# Patient Record
Sex: Male | Born: 1940 | Race: White | Hispanic: No | Marital: Married | Smoking: Former smoker
Health system: Southern US, Community
[De-identification: ages and names within clinical notes are randomized; demographics above are authoritative.]

## PROBLEM LIST (undated history)

## (undated) DIAGNOSIS — I10 Essential (primary) hypertension: Secondary | ICD-10-CM

## (undated) DIAGNOSIS — M109 Gout, unspecified: Secondary | ICD-10-CM

## (undated) DIAGNOSIS — I119 Hypertensive heart disease without heart failure: Secondary | ICD-10-CM

## (undated) DIAGNOSIS — I5033 Acute on chronic diastolic (congestive) heart failure: Secondary | ICD-10-CM

## (undated) HISTORY — DX: Essential (primary) hypertension: I10

## (undated) HISTORY — PX: KNEE CARTILAGE SURGERY: SHX688

---

## 1997-12-11 ENCOUNTER — Encounter: Admission: RE | Admit: 1997-12-11 | Discharge: 1997-12-11 | Payer: Self-pay | Admitting: Family Medicine

## 1997-12-18 ENCOUNTER — Encounter: Admission: RE | Admit: 1997-12-18 | Discharge: 1997-12-18 | Payer: Self-pay | Admitting: Family Medicine

## 1997-12-22 ENCOUNTER — Encounter: Admission: RE | Admit: 1997-12-22 | Discharge: 1997-12-22 | Payer: Self-pay | Admitting: Family Medicine

## 1997-12-29 ENCOUNTER — Encounter: Admission: RE | Admit: 1997-12-29 | Discharge: 1997-12-29 | Payer: Self-pay | Admitting: Family Medicine

## 1998-01-05 ENCOUNTER — Encounter: Admission: RE | Admit: 1998-01-05 | Discharge: 1998-01-05 | Payer: Self-pay | Admitting: Family Medicine

## 1998-01-11 ENCOUNTER — Encounter: Admission: RE | Admit: 1998-01-11 | Discharge: 1998-01-11 | Payer: Self-pay | Admitting: Family Medicine

## 1998-01-12 ENCOUNTER — Encounter: Admission: RE | Admit: 1998-01-12 | Discharge: 1998-01-12 | Payer: Self-pay | Admitting: Family Medicine

## 1998-01-18 ENCOUNTER — Encounter: Admission: RE | Admit: 1998-01-18 | Discharge: 1998-01-18 | Payer: Self-pay | Admitting: Family Medicine

## 1998-01-19 ENCOUNTER — Encounter: Admission: RE | Admit: 1998-01-19 | Discharge: 1998-01-19 | Payer: Self-pay | Admitting: Family Medicine

## 1998-02-08 ENCOUNTER — Encounter: Admission: RE | Admit: 1998-02-08 | Discharge: 1998-02-08 | Payer: Self-pay | Admitting: Family Medicine

## 1998-03-07 ENCOUNTER — Encounter: Admission: RE | Admit: 1998-03-07 | Discharge: 1998-03-07 | Payer: Self-pay | Admitting: Family Medicine

## 1998-03-08 ENCOUNTER — Encounter: Admission: RE | Admit: 1998-03-08 | Discharge: 1998-03-08 | Payer: Self-pay | Admitting: Family Medicine

## 1999-05-03 ENCOUNTER — Encounter: Admission: RE | Admit: 1999-05-03 | Discharge: 1999-05-03 | Payer: Self-pay | Admitting: Family Medicine

## 1999-07-17 ENCOUNTER — Encounter: Admission: RE | Admit: 1999-07-17 | Discharge: 1999-07-17 | Payer: Self-pay | Admitting: Family Medicine

## 1999-07-18 ENCOUNTER — Encounter: Admission: RE | Admit: 1999-07-18 | Discharge: 1999-07-18 | Payer: Self-pay | Admitting: Family Medicine

## 2000-09-14 ENCOUNTER — Ambulatory Visit (HOSPITAL_COMMUNITY): Admission: RE | Admit: 2000-09-14 | Discharge: 2000-09-14 | Payer: Self-pay | Admitting: Family Medicine

## 2000-09-14 ENCOUNTER — Encounter: Admission: RE | Admit: 2000-09-14 | Discharge: 2000-09-14 | Payer: Self-pay | Admitting: Family Medicine

## 2000-09-17 ENCOUNTER — Encounter: Admission: RE | Admit: 2000-09-17 | Discharge: 2000-09-17 | Payer: Self-pay | Admitting: Family Medicine

## 2000-10-12 ENCOUNTER — Encounter: Admission: RE | Admit: 2000-10-12 | Discharge: 2000-10-12 | Payer: Self-pay | Admitting: Family Medicine

## 2000-10-13 ENCOUNTER — Encounter: Admission: RE | Admit: 2000-10-13 | Discharge: 2000-10-13 | Payer: Self-pay | Admitting: Sports Medicine

## 2000-10-23 ENCOUNTER — Encounter: Admission: RE | Admit: 2000-10-23 | Discharge: 2000-10-23 | Payer: Self-pay | Admitting: Family Medicine

## 2000-11-09 ENCOUNTER — Encounter: Admission: RE | Admit: 2000-11-09 | Discharge: 2000-11-09 | Payer: Self-pay | Admitting: Family Medicine

## 2000-11-16 ENCOUNTER — Encounter: Admission: RE | Admit: 2000-11-16 | Discharge: 2000-11-16 | Payer: Self-pay | Admitting: Family Medicine

## 2000-11-23 ENCOUNTER — Encounter: Admission: RE | Admit: 2000-11-23 | Discharge: 2000-11-23 | Payer: Self-pay | Admitting: Family Medicine

## 2001-01-12 ENCOUNTER — Encounter: Admission: RE | Admit: 2001-01-12 | Discharge: 2001-01-12 | Payer: Self-pay | Admitting: Family Medicine

## 2001-01-19 ENCOUNTER — Encounter: Admission: RE | Admit: 2001-01-19 | Discharge: 2001-01-19 | Payer: Self-pay | Admitting: Family Medicine

## 2001-01-22 ENCOUNTER — Encounter: Admission: RE | Admit: 2001-01-22 | Discharge: 2001-01-22 | Payer: Self-pay | Admitting: Family Medicine

## 2001-02-05 ENCOUNTER — Encounter: Admission: RE | Admit: 2001-02-05 | Discharge: 2001-02-05 | Payer: Self-pay | Admitting: Family Medicine

## 2001-02-12 ENCOUNTER — Encounter: Admission: RE | Admit: 2001-02-12 | Discharge: 2001-02-12 | Payer: Self-pay | Admitting: Family Medicine

## 2001-02-26 ENCOUNTER — Encounter: Admission: RE | Admit: 2001-02-26 | Discharge: 2001-02-26 | Payer: Self-pay | Admitting: Family Medicine

## 2001-03-05 ENCOUNTER — Encounter: Admission: RE | Admit: 2001-03-05 | Discharge: 2001-03-05 | Payer: Self-pay | Admitting: Family Medicine

## 2001-03-10 ENCOUNTER — Encounter: Admission: RE | Admit: 2001-03-10 | Discharge: 2001-03-10 | Payer: Self-pay | Admitting: Family Medicine

## 2001-03-18 ENCOUNTER — Encounter: Admission: RE | Admit: 2001-03-18 | Discharge: 2001-03-18 | Payer: Self-pay | Admitting: Family Medicine

## 2001-03-25 ENCOUNTER — Encounter: Admission: RE | Admit: 2001-03-25 | Discharge: 2001-03-25 | Payer: Self-pay | Admitting: Family Medicine

## 2001-04-01 ENCOUNTER — Encounter: Admission: RE | Admit: 2001-04-01 | Discharge: 2001-04-01 | Payer: Self-pay | Admitting: Family Medicine

## 2001-04-09 ENCOUNTER — Encounter: Admission: RE | Admit: 2001-04-09 | Discharge: 2001-04-09 | Payer: Self-pay | Admitting: Family Medicine

## 2001-04-21 ENCOUNTER — Encounter: Admission: RE | Admit: 2001-04-21 | Discharge: 2001-04-21 | Payer: Self-pay | Admitting: Family Medicine

## 2001-11-19 ENCOUNTER — Encounter: Admission: RE | Admit: 2001-11-19 | Discharge: 2001-11-19 | Payer: Self-pay | Admitting: Family Medicine

## 2001-11-22 ENCOUNTER — Encounter: Admission: RE | Admit: 2001-11-22 | Discharge: 2001-11-22 | Payer: Self-pay | Admitting: Family Medicine

## 2001-11-26 ENCOUNTER — Encounter: Admission: RE | Admit: 2001-11-26 | Discharge: 2001-11-26 | Payer: Self-pay | Admitting: Family Medicine

## 2001-12-10 ENCOUNTER — Encounter: Admission: RE | Admit: 2001-12-10 | Discharge: 2001-12-10 | Payer: Self-pay | Admitting: Family Medicine

## 2001-12-20 ENCOUNTER — Encounter: Admission: RE | Admit: 2001-12-20 | Discharge: 2001-12-20 | Payer: Self-pay | Admitting: Family Medicine

## 2002-01-07 ENCOUNTER — Encounter: Admission: RE | Admit: 2002-01-07 | Discharge: 2002-01-07 | Payer: Self-pay | Admitting: Family Medicine

## 2002-01-31 ENCOUNTER — Encounter: Admission: RE | Admit: 2002-01-31 | Discharge: 2002-01-31 | Payer: Self-pay | Admitting: Sports Medicine

## 2002-02-28 ENCOUNTER — Encounter: Admission: RE | Admit: 2002-02-28 | Discharge: 2002-02-28 | Payer: Self-pay | Admitting: Family Medicine

## 2002-08-26 ENCOUNTER — Encounter: Admission: RE | Admit: 2002-08-26 | Discharge: 2002-08-26 | Payer: Self-pay | Admitting: Family Medicine

## 2002-10-31 ENCOUNTER — Encounter: Admission: RE | Admit: 2002-10-31 | Discharge: 2002-10-31 | Payer: Self-pay | Admitting: Family Medicine

## 2002-11-02 ENCOUNTER — Encounter: Admission: RE | Admit: 2002-11-02 | Discharge: 2002-11-02 | Payer: Self-pay | Admitting: Family Medicine

## 2002-11-14 ENCOUNTER — Encounter: Admission: RE | Admit: 2002-11-14 | Discharge: 2002-11-14 | Payer: Self-pay | Admitting: Family Medicine

## 2003-05-08 ENCOUNTER — Encounter: Admission: RE | Admit: 2003-05-08 | Discharge: 2003-05-08 | Payer: Self-pay | Admitting: Sports Medicine

## 2004-06-24 ENCOUNTER — Ambulatory Visit: Payer: Self-pay | Admitting: Family Medicine

## 2005-09-22 ENCOUNTER — Ambulatory Visit: Payer: Self-pay | Admitting: Family Medicine

## 2006-11-05 DIAGNOSIS — K210 Gastro-esophageal reflux disease with esophagitis: Secondary | ICD-10-CM

## 2006-11-05 DIAGNOSIS — I10 Essential (primary) hypertension: Secondary | ICD-10-CM

## 2006-11-05 DIAGNOSIS — E781 Pure hyperglyceridemia: Secondary | ICD-10-CM | POA: Insufficient documentation

## 2006-11-05 DIAGNOSIS — H269 Unspecified cataract: Secondary | ICD-10-CM

## 2007-01-04 ENCOUNTER — Telehealth: Payer: Self-pay | Admitting: Family Medicine

## 2007-01-08 ENCOUNTER — Ambulatory Visit: Payer: Self-pay | Admitting: Family Medicine

## 2007-01-08 DIAGNOSIS — F410 Panic disorder [episodic paroxysmal anxiety] without agoraphobia: Secondary | ICD-10-CM

## 2007-01-11 ENCOUNTER — Encounter: Payer: Self-pay | Admitting: Family Medicine

## 2007-01-11 ENCOUNTER — Ambulatory Visit: Payer: Self-pay | Admitting: Family Medicine

## 2007-01-11 LAB — CONVERTED CEMR LAB
ALT: 20 units/L (ref 0–53)
AST: 13 units/L (ref 0–37)
Alkaline Phosphatase: 78 units/L (ref 39–117)
Cholesterol: 158 mg/dL (ref 0–200)
Creatinine, Ser: 0.72 mg/dL (ref 0.40–1.50)
Hemoglobin: 13.2 g/dL
LDL Cholesterol: 87 mg/dL (ref 0–99)
MCV: 79.5 fL
PSA: 1.06 ng/mL (ref 0.10–4.00)
Platelets: 222 10*3/uL
RBC: 4.95 M/uL
Sodium: 144 meq/L (ref 135–145)
TSH: 2.196 microintl units/mL (ref 0.350–5.50)
Total Bilirubin: 0.5 mg/dL (ref 0.3–1.2)
Total CHOL/HDL Ratio: 3.8
Total Protein: 6.6 g/dL (ref 6.0–8.3)
VLDL: 29 mg/dL (ref 0–40)

## 2007-10-14 ENCOUNTER — Encounter: Payer: Self-pay | Admitting: Family Medicine

## 2007-10-25 ENCOUNTER — Encounter: Payer: Self-pay | Admitting: Family Medicine

## 2007-10-28 ENCOUNTER — Encounter: Payer: Self-pay | Admitting: Family Medicine

## 2007-11-01 ENCOUNTER — Encounter: Payer: Self-pay | Admitting: Family Medicine

## 2007-11-15 ENCOUNTER — Encounter: Payer: Self-pay | Admitting: Family Medicine

## 2007-11-24 ENCOUNTER — Encounter: Payer: Self-pay | Admitting: Family Medicine

## 2007-12-09 ENCOUNTER — Encounter: Payer: Self-pay | Admitting: Family Medicine

## 2008-04-25 ENCOUNTER — Encounter: Payer: Self-pay | Admitting: *Deleted

## 2008-04-26 ENCOUNTER — Telehealth: Payer: Self-pay | Admitting: Family Medicine

## 2008-07-11 ENCOUNTER — Telehealth: Payer: Self-pay | Admitting: Family Medicine

## 2009-02-19 ENCOUNTER — Telehealth: Payer: Self-pay | Admitting: Family Medicine

## 2009-04-20 ENCOUNTER — Ambulatory Visit: Payer: Self-pay | Admitting: Family Medicine

## 2009-04-20 DIAGNOSIS — M109 Gout, unspecified: Secondary | ICD-10-CM

## 2009-04-26 ENCOUNTER — Ambulatory Visit: Payer: Self-pay | Admitting: Family Medicine

## 2009-04-26 ENCOUNTER — Encounter: Payer: Self-pay | Admitting: Family Medicine

## 2009-05-01 ENCOUNTER — Encounter: Payer: Self-pay | Admitting: Family Medicine

## 2009-05-01 LAB — CONVERTED CEMR LAB
AST: 16 units/L (ref 0–37)
BUN: 10 mg/dL (ref 6–23)
Calcium: 8.5 mg/dL (ref 8.4–10.5)
Chloride: 103 meq/L (ref 96–112)
Creatinine, Ser: 0.71 mg/dL (ref 0.40–1.50)
HDL: 37 mg/dL — ABNORMAL LOW (ref >39)
Total Bilirubin: 0.5 mg/dL (ref 0.3–1.2)
Total CHOL/HDL Ratio: 5.3
Uric Acid, Serum: 9.4 mg/dL — ABNORMAL HIGH (ref 4.0–7.8)
VLDL: 74 mg/dL — ABNORMAL HIGH (ref 0–40)

## 2009-06-05 ENCOUNTER — Telehealth: Payer: Self-pay | Admitting: Family Medicine

## 2009-08-24 ENCOUNTER — Encounter: Payer: Self-pay | Admitting: Family Medicine

## 2010-01-11 ENCOUNTER — Encounter: Payer: Self-pay | Admitting: Family Medicine

## 2010-01-30 ENCOUNTER — Telehealth: Payer: Self-pay | Admitting: Family Medicine

## 2010-02-27 ENCOUNTER — Ambulatory Visit: Payer: Self-pay | Admitting: Family Medicine

## 2010-02-27 DIAGNOSIS — M25559 Pain in unspecified hip: Secondary | ICD-10-CM

## 2010-02-27 LAB — CONVERTED CEMR LAB
ALT: 27 units/L (ref 0–53)
CO2: 31 meq/L (ref 19–32)
Creatinine, Ser: 0.87 mg/dL (ref 0.40–1.50)
Glucose, Bld: 106 mg/dL — ABNORMAL HIGH (ref 70–99)
Total Bilirubin: 0.4 mg/dL (ref 0.3–1.2)

## 2010-02-28 ENCOUNTER — Telehealth: Payer: Self-pay | Admitting: Family Medicine

## 2010-03-15 ENCOUNTER — Telehealth: Payer: Self-pay | Admitting: Family Medicine

## 2010-03-28 ENCOUNTER — Encounter: Payer: Self-pay | Admitting: Family Medicine

## 2010-04-08 ENCOUNTER — Telehealth: Payer: Self-pay | Admitting: Family Medicine

## 2010-10-08 NOTE — Assessment & Plan Note (Signed)
Summary: f/up,tcb   Vital Signs:  Patient profile:   70 year old male Height:      72 inches Weight:      285 pounds BMI:     38.79 Temp:     97.5 degrees F oral Pulse rate:   56 / minute BP sitting:   190 / 84  (left arm) Cuff size:   large  Vitals Entered By: Tessie Fass CMA (February 27, 2010 2:41 PM) CC: F/U BP Is Patient Diabetic? No Pain Assessment Patient in pain? no        CC:  F/U BP.  History of Present Illness: Feels good.  Only complaint is right hip pain.  Seems to radiate from back.  Never had Xrays. Poor frequency of FU continues. HBP remains suboptimally controled despite multiple meds. No progress with obesity.  States exercising less due to hip pain. Also C/O gout attacks.  Has not yet started allopurinol but he will   Habits & Providers  Alcohol-Tobacco-Diet     Tobacco Status: quit  Current Medications (verified): 1)  Atenolol 100 Mg Tabs (Atenolol) .... One By Mouth Two Times A Day 2)  Colchicine 0.6 Mg Tabs (Colchicine) .... One Tab Q1h As Needed Gout Attack.  Max 6 Tabs Per Day 3)  Enalapril Maleate 20 Mg  Tabs (Enalapril Maleate) .... One By Mouth Two Times Per Day 4)  Hydrochlorothiazide 25 Mg Tabs (Hydrochlorothiazide) .... One By Mouth Daily 5)  Terazosin Hcl 10 Mg Caps (Terazosin Hcl) .... One By Mouth Daily 6)  Amlodipine Besylate 10 Mg  Tabs (Amlodipine Besylate) .... Once Daily 7)  Paroxetine Hcl 20 Mg  Tabs (Paroxetine Hcl) .... One By Mouth Daily 8)  Allopurinol 100 Mg Tabs (Allopurinol) .... One By Mouth Daily For Two Weeks Then Two By Mouth Daily For Two Weeks Then Three By Mouth Daily. 9)  Aspirin 81 Mg  Tbec (Aspirin) .... One By Mouth Every Day  Allergies (verified): No Known Drug Allergies  Social History: non smoker quit 1985;  insig ETOH;  exercises none at present; retired as of 2010  diet, low Na+, low cholSmoking Status:  quit  Review of Systems  The patient denies chest pain, syncope, peripheral edema, headaches,  and transient blindness.    Physical Exam  General:  Well-developed,well-nourished,in no acute distress; alert,appropriate and cooperative throughout examination Lungs:  Normal respiratory effort, chest expands symmetrically. Lungs are clear to auscultation, no crackles or wheezes. Heart:  Normal rate and regular rhythm. S1 and S2 normal without gallop, murmur, click, rub or other extra sounds. Abdomen:  Bowel sounds positive,abdomen soft and non-tender without masses, organomegaly or hernias noted. Extremities:  No clubbing, cyanosis, edema, or deformity noted with normal full range of motion of all joints.     Impression & Recommendations:  Problem # 1:  HYPERTENSION, BENIGN SYSTEMIC (ICD-401.1) Assessment Deteriorated Increase enalapril.  FU 2 months. His updated medication list for this problem includes:    Atenolol 100 Mg Tabs (Atenolol) ..... One by mouth two times a day    Enalapril Maleate 20 Mg Tabs (Enalapril maleate) ..... One by mouth two times per day    Hydrochlorothiazide 25 Mg Tabs (Hydrochlorothiazide) ..... One by mouth daily    Terazosin Hcl 10 Mg Caps (Terazosin hcl) ..... One by mouth daily    Amlodipine Besylate 10 Mg Tabs (Amlodipine besylate) ..... Once daily  Orders: Comp Met-[facility] [id number]) [facility]- Est  Level 4 ([address]214)  BP today: 190/84 Prior BP: 207/75 (04/20/2009)  Labs  Reviewed: K+: 3.9 (04/26/2009) Creat: : 0.71 (04/26/2009)   Chol: 197 (04/26/2009)   HDL: 37 (04/26/2009)   LDL: 86 (04/26/2009)   TG: 368 (04/26/2009)  Problem # 2:  GOUT, UNSPECIFIED (ICD-274.9)  Start allopurinol His updated medication list for this problem includes:    Colchicine 0.6 Mg Tabs (Colchicine) ..... One tab q1h as needed gout attack.  max 6 tabs per day    Allopurinol 100 Mg Tabs (Allopurinol) ..... One by mouth daily for two weeks then two by mouth daily for two weeks then three by mouth daily.  Orders: [facility]- Est  Level 4 ([address]214)  Problem # 3:  OBESITY, NOS  (ICD-278.00) Assessment: Unchanged  diet and exercise  Orders: [facility]- Est  Level 4 ([address]295)  Problem # 4:  HIP PAIN (MWU-132.44) Assessment: New Start with Xrays. His updated medication list for this problem includes:    Aspirin 81 Mg Tbec (Aspirin) ..... One by mouth every day  Orders: Radiology other (Radiology Other) [facility]- Est  Level 4 (01027)  Complete Medication List: 1)  Atenolol 100 Mg Tabs (Atenolol) .... One by mouth two times a day 2)  Colchicine 0.6 Mg Tabs (Colchicine) .... One tab q1h as needed gout attack.  max 6 tabs per day 3)  Enalapril Maleate 20 Mg Tabs (Enalapril maleate) .... One by mouth two times per day 4)  Hydrochlorothiazide 25 Mg Tabs (Hydrochlorothiazide) .... One by mouth daily 5)  Terazosin Hcl 10 Mg Caps (Terazosin hcl) .... One by mouth daily 6)  Amlodipine Besylate 10 Mg Tabs (Amlodipine besylate) .... Once daily 7)  Paroxetine Hcl 20 Mg Tabs (Paroxetine hcl) .... One by mouth daily 8)  Allopurinol 100 Mg Tabs (Allopurinol) .... One by mouth daily for two weeks then two by mouth daily for two weeks then three by mouth daily. 9)  Aspirin 81 Mg Tbec (Aspirin) .... One by mouth every day  Other Orders: Hemoccult Cards (Take Home) (Hemoccult Cards) Prescriptions: PAROXETINE HCL 20 MG  TABS (PAROXETINE HCL) one by mouth daily  #90 x 3   Entered and Authorized by:   Doralee Albino MD   Signed by:   Doralee Albino MD on 02/27/2010   Method used:   Electronically to        [facility]. [phone]* (retail)       [address]       [place], [place]  [address]       Ph: [phone]       Fax: [phone]   RxID:   [id number] AMLODIPINE BESYLATE 10 MG  TABS (AMLODIPINE BESYLATE) once daily  #90 x 3   Entered and Authorized by:   Doralee Albino MD   Signed by:   Doralee Albino MD on 02/27/2010   Method used:   Electronically to        [facility]. [phone]* (retail)       [address]        [place], [place]  [address]       Ph: [phone]       Fax: [phone]   RxID:   [id number] TERAZOSIN HCL 10 MG CAPS (TERAZOSIN HCL) one by mouth daily  #90 x 3   Entered and Authorized by:   Doralee Albino MD   Signed by:   Doralee Albino MD on 02/27/2010   Method used:   Electronically to  [facility]. [phone]* (retail)       [address]       [place], [place]  [address]       Ph: [phone]       Fax: [phone]   RxID:   [id number] HYDROCHLOROTHIAZIDE 25 MG TABS (HYDROCHLOROTHIAZIDE) One by mouth daily  #90 x 3   Entered and Authorized by:   Doralee Albino MD   Signed by:   Doralee Albino MD on 02/27/2010   Method used:   Electronically to        [facility]. [phone]* (retail)       [address]       [place], [place]  [address]       Ph: [phone]       Fax: [phone]   RxID:   [id number] ENALAPRIL MALEATE 20 MG  TABS (ENALAPRIL MALEATE) one by mouth two times per day  #180 x 3   Entered and Authorized by:   Doralee Albino MD   Signed by:   Doralee Albino MD on 02/27/2010   Method used:   Electronically to        [facility]. [phone]* (retail)       [address]       [place], [place]  [address]       Ph: [phone]       Fax: [phone]   RxID:   [id number] ATENOLOL 100 MG TABS (ATENOLOL) one by mouth two times a day  #180 x 3   Entered and Authorized by:   Doralee Albino MD   Signed by:   Doralee Albino MD on 02/27/2010   Method used:   Electronically to        [facility]. [phone]* (retail)       [address]       [place], [place]  [address]       Ph: [phone]       Fax: [phone]   RxID:   [id number]    Prevention & Chronic Care Immunizations   Influenza vaccine: Historical  (08/24/2009)   Influenza vaccine due: 05/09/2010    Tetanus  booster: 04/20/2009: Tdap    Pneumococcal vaccine: Pneumovax (Medicare)  (04/20/2009)    H. zoster vaccine: Not documented  Colorectal Screening   Hemoccult: Done.  (01/06/2002)   Hemoccult action/deferral: Ordered  (02/27/2010)    Colonoscopy: Not documented   Colonoscopy action/deferral: Not indicated  (02/27/2010)  Other Screening   PSA: 1.06  (01/11/2007)   PSA action/deferral: Discussed-PSA declined  (04/20/2009)   Smoking status: quit  (02/27/2010)  Lipids   Total Cholesterol: 197  (04/26/2009)   Lipid panel action/deferral: Lipid Panel ordered   LDL: 86  (04/26/2009)   LDL Direct: Not documented   HDL: 37  (04/26/2009)   Triglycerides: 368  (04/26/2009)    SGOT (AST): 16  (04/26/2009)   BMP action: Ordered   SGPT (ALT): 25  (04/26/2009) CMP ordered    Alkaline phosphatase: 92  (04/26/2009)   Total bilirubin: 0.5  (04/26/2009)    Lipid flowsheet reviewed?: Yes   Progress toward LDL goal: At goal  Hypertension   Last Blood Pressure: 190 / 84  (02/27/2010)   Serum creatinine: 0.71  (04/26/2009)   Serum potassium 3.9  (  04/26/2009) CMP ordered     Hypertension flowsheet reviewed?: Yes   Progress toward BP goal: Unchanged  Self-Management Support :    Hypertension self-management support: Not documented    Lipid self-management support: Not documented    Nursing Instructions: Provide Hemoccult cards with instructions (see order)   Appended Document: hemoccult card results  Laboratory Results  Date/Time Received: April 22, 2010  Date/Time Reported: April 22, 2010 4:08 PM   Stool - Occult Blood Hemmoccult #1: negative Date: 04/19/2010 Hemoccult #2: negative Date: 04/20/2010 Hemoccult #3: negative Date: 04/21/2010 Comments: ...........test performed by...........Marland KitchenTerese Door, CMA       Prevention & Chronic Care Immunizations   Influenza vaccine: Historical  (08/24/2009)   Influenza vaccine due: 05/09/2010    Tetanus booster:  04/20/2009: Tdap    Pneumococcal vaccine: Pneumovax (Medicare)  (04/20/2009)    H. zoster vaccine: Not documented  Colorectal Screening   Hemoccult: Done.  (01/06/2002)   Hemoccult action/deferral: Ordered  (02/27/2010)    Colonoscopy: Not documented   Colonoscopy action/deferral: Not indicated  (02/27/2010)  Other Screening   PSA: 1.06  (01/11/2007)   PSA action/deferral: Discussed-PSA declined  (04/20/2009)   Smoking status: quit  (02/27/2010)  Lipids   Total Cholesterol: 197  (04/26/2009)   Lipid panel action/deferral: Lipid Panel ordered   LDL: 86  (04/26/2009)   LDL Direct: Not documented   HDL: 37  (04/26/2009)   Triglycerides: 368  (04/26/2009)    SGOT (AST): 20  (02/27/2010)   BMP action: Ordered   SGPT (ALT): 27  (02/27/2010)   Alkaline phosphatase: 84  (02/27/2010)   Total bilirubin: 0.4  (02/27/2010)  Hypertension   Last Blood Pressure: 190 / 84  (02/27/2010)   Serum creatinine: 0.87  (02/27/2010)   Serum potassium 4.1  (02/27/2010)  Self-Management Support :    Hypertension self-management support: Not documented    Lipid self-management support: Not documented     Appended Document: f/up,tcb    Clinical Lists Changes   Observations: Added new observation of DM PROGRESS: N/A (04/22/2010 16:35) Added new observation of DM FSREVIEW: N/A (04/22/2010 16:35) Added new observation of HEMOCCULT: neg x 3 (04/21/2010 16:35)        Prevention & Chronic Care Immunizations   Influenza vaccine: Historical  (08/24/2009)   Influenza vaccine due: 05/09/2010    Tetanus booster: 04/20/2009: Tdap    Pneumococcal vaccine: Pneumovax (Medicare)  (04/20/2009)    H. zoster vaccine: Not documented  Colorectal Screening   Hemoccult: neg x 3  (04/21/2010)   Hemoccult action/deferral: Ordered  (02/27/2010)    Colonoscopy: Not documented   Colonoscopy action/deferral: Not indicated  (02/27/2010)  Other Screening   PSA: 1.06  (01/11/2007)   PSA  action/deferral: Discussed-PSA declined  (04/20/2009)   Smoking status: quit  (02/27/2010)  Lipids   Total Cholesterol: 197  (04/26/2009)   Lipid panel action/deferral: Lipid Panel ordered   LDL: 86  (04/26/2009)   LDL Direct: Not documented   HDL: 37  (04/26/2009)   Triglycerides: 368  (04/26/2009)    SGOT (AST): 20  (02/27/2010)   BMP action: Ordered   SGPT (ALT): 27  (02/27/2010)   Alkaline phosphatase: 84  (02/27/2010)   Total bilirubin: 0.4  (02/27/2010)  Hypertension   Last Blood Pressure: 190 / 84  (02/27/2010)   Serum creatinine: 0.87  (02/27/2010)   Serum potassium 4.1  (02/27/2010)  Self-Management Support :    Hypertension self-management support: Not documented    Lipid self-management support: Not documented  Appended Document: f/up,tcb    Clinical Lists Changes  Observations: Added new observation of HEMOCULTDUE: 04/22/2011 (04/22/2010 16:39) Added new observation of DM PROGRESS: N/A (04/22/2010 16:39) Added new observation of DM FSREVIEW: N/A (04/22/2010 16:39)       Prevention & Chronic Care Immunizations   Influenza vaccine: Historical  (08/24/2009)   Influenza vaccine due: 05/09/2010    Tetanus booster: 04/20/2009: Tdap    Pneumococcal vaccine: Pneumovax (Medicare)  (04/20/2009)    H. zoster vaccine: Not documented  Colorectal Screening   Hemoccult: neg x 3  (04/21/2010)   Hemoccult action/deferral: Ordered  (02/27/2010)   Hemoccult due: 04/22/2011    Colonoscopy: Not documented   Colonoscopy action/deferral: Not indicated  (02/27/2010)  Other Screening   PSA: 1.06  (01/11/2007)   PSA action/deferral: Discussed-PSA declined  (04/20/2009)   Smoking status: quit  (02/27/2010)  Lipids   Total Cholesterol: 197  (04/26/2009)   Lipid panel action/deferral: Lipid Panel ordered   LDL: 86  (04/26/2009)   LDL Direct: Not documented   HDL: 37  (04/26/2009)   Triglycerides: 368  (04/26/2009)    SGOT (AST): 20  (02/27/2010)    BMP action: Ordered   SGPT (ALT): 27  (02/27/2010)   Alkaline phosphatase: 84  (02/27/2010)   Total bilirubin: 0.4  (02/27/2010)  Hypertension   Last Blood Pressure: 190 / 84  (02/27/2010)   Serum creatinine: 0.87  (02/27/2010)   Serum potassium 4.1  (02/27/2010)  Self-Management Support :    Hypertension self-management support: Not documented    Lipid self-management support: Not documented

## 2010-10-08 NOTE — Progress Notes (Signed)
Summary: Rx Req  Phone Note Refill Request Call back at Home Phone [phone] Message from:  Patient  Refills Requested: Medication #1:  TERAZOSIN HCL 10 MG CAPS one by mouth daily NEEDS NEW RX SENT IN TO RX BECAUSE HE IS GOING OUT OF TOWN TOMORROW.  [facility].  Initial call taken by: Clydell Hakim,  Jan 30, 2010 3:53 PM  Follow-up for Phone Call        Called.  Rx sent.  He says he will make an appointment for as soon as he gets back from vacation Follow-up by: Doralee Albino MD,  Jan 30, 2010 4:03 PM

## 2010-10-08 NOTE — Progress Notes (Signed)
Summary: Rx Prob  Phone Note Call from Patient Call back at Home Phone [phone]   Caller: Patient Summary of Call: Was instructed yesterday to increase medication, but now can't remember which one it was. Initial call taken by: Clydell Hakim,  February 28, 2010 3:05 PM  Follow-up for Phone Call        advised patient that MD had increased Enalapril 20 mg  to one tablet twice daily.. Follow-up by: Theresia Lo RN,  February 28, 2010 3:29 PM  Additional Follow-up for Phone Call Additional follow up Details #1::        This is correct. Additional Follow-up by: Doralee Albino MD,  March 01, 2010 8:25 AM

## 2010-10-08 NOTE — Progress Notes (Signed)
Summary: meds prob  Phone Note Call from Patient Call back at Home Phone [phone]   Caller: Patient Summary of Call: pt states that he is allergic to the Allopurinol and needs something different Initial call taken by: De Nurse,  March 15, 2010 12:13 PM  Follow-up for Phone Call        to pcp Follow-up by: Golden Circle RN,  March 15, 2010 12:22 PM  Additional Follow-up for Phone Call Additional follow up Details #1::        Added allopurinol to allergy list.  Was only taking cochicine for acute attacks.  Will try daily colchicine.   Additional Follow-up by: Doralee Albino MD,  March 15, 2010 12:47 PM   New Allergies: ! ALLOPURINOL (ALLOPURINOL) New/Updated Medications: COLCHICINE 0.6 MG TABS (COLCHICINE) One pill daily to prevent gout:  one tab q1h as needed gout attack.  Max 6 tabs per day New Allergies: ! ALLOPURINOL (ALLOPURINOL)

## 2010-10-08 NOTE — Progress Notes (Signed)
Summary: refill  Phone Note Refill Request Call back at Home Phone [phone] Message from:  Patient  Refills Requested: Medication #1:  COLCHICINE 0.6 MG TABS One pill daily to prevent gout:  one tab q1h as needed gout attack.  Max 6 tabs per day should he continue to take this or does it need to be changed  Initial call taken by: De Nurse,  April 08, 2010 2:27 PM  Follow-up for Phone Call        Done and discussed with patient. Follow-up by: Doralee Albino MD,  April 08, 2010 2:43 PM    New/Updated Medications: COLCRYS 0.6 MG TABS (COLCHICINE) one by mouth daily to prevent gout attackes. Prescriptions: COLCRYS 0.6 MG TABS (COLCHICINE) one by mouth daily to prevent gout attackes.  #90 x 3   Entered and Authorized by:   Doralee Albino MD   Signed by:   Doralee Albino MD on 04/08/2010   Method used:   Electronically to        [facility]. [phone]* (retail)       [address]       [place], [place]  [address]       Ph: [phone]       Fax: [phone]   RxID:   [id number]

## 2010-10-08 NOTE — Letter (Signed)
Summary: Lupita Dawn visit letter  [facility]  [address]   [place], [place] [address]   Phone: [phone]  Fax: [phone]    01/11/2010  ELIAZ FOUT [address] [place], [place]  [address]  Dear Mr. SAULTER,   We are happy to let you know that since you are covered under Medicare you are able to have a FREE visit at the [facility] to discuss your HEALTH. This is a new benefit for Medicare.  There will be no co-payment.  At this visit you will meet with Luretha Murphy an expert in wellness and the nurse practitioner at our clinic.  At this visit we will discuss ways to keep you healthy and feeling well.  This visit will not replace your regular doctor visit and we cannot refill medications.  We may schedule future blood work, give shots if needed, or schedule tests to look for hidden problems.   You will need to plan to be here at least one hour to talk about your medical history, your current status, review all of your medications, and discuss your future plans for your health.  This information will be entered into your record for your doctor to have and review.  If you are interested in staying healthy, this type of visit can help.  Please call the office at: [phone], to schedule a "Medicare Wellness Visit".  The day of the visit you should bring in all of your medications, including any vitamins, herbs, over the counter products you take.  Make a list of all the other doctors that you see, so we know who they are. If you have any other health documents please bring them.  We look forward to helping you stay healthy.  Sincerely,  Luretha Murphy NP

## 2010-11-19 ENCOUNTER — Encounter: Payer: Self-pay | Admitting: Home Health Services

## 2011-01-02 ENCOUNTER — Ambulatory Visit: Payer: Self-pay | Admitting: Home Health Services

## 2011-01-07 ENCOUNTER — Ambulatory Visit (INDEPENDENT_AMBULATORY_CARE_PROVIDER_SITE_OTHER): Payer: Medicare Other | Admitting: Home Health Services

## 2011-01-07 ENCOUNTER — Encounter: Payer: Self-pay | Admitting: Home Health Services

## 2011-01-07 VITALS — BP 197/81 | HR 49 | Temp 98.2°F | Ht 72.5 in | Wt 293.0 lb

## 2011-01-07 DIAGNOSIS — Z0000 Encounter for general adult medical examination without abnormal findings: Secondary | ICD-10-CM

## 2011-01-07 NOTE — Patient Instructions (Signed)
1. Start exercising 3-4 times a week for 30 minutes. 2. Work on losing some weight. 3. Measure blood pressure and record blood pressure every day.  Bring records to Dr. Leveda Anna. 4. Be sure to take medications everyday.  5. Review your medical wishes with family and bring a copy to Dr. Leveda Anna.

## 2011-01-07 NOTE — Progress Notes (Signed)
Patient here for annual wellness visit, patient reports: Risk Factors/Conditions needing evaluation or treatment: Patient's BP was high today. Scheduled fu appointment with PCP for next week. Home Safety: Patient lives with wife in 1 story home.  Patient has smoke detectors and does not have adaptive equipment in bathroom.  Other Information: Corrective lens: Patient wears corrective lens for reading and visits eye doctor frequently for cataract in left eye. Dentures: Patient does not have dentures and visits dentist as needed. Memory: Patient denies memory problems. Patient's Mini Mental Score (recorded in doc. flowsheet): 30 Patient reported hearing problems and will schedule an appointment with and audiologist.    Balance Abnormal Patient value  Sitting balance    Arise  Uses arms  Attempts to arise    Immediate standing balance    Standing balance    Nudge    Eyes closed    360 degree turn    Sitting down  Uses arms   Gait Abnormal Patient value  Initiation of gait    Step length-left    Step length-right    Step height-left    Step height-right    Step symmetry    Step continuity    Path    Trunk    Walking stance     Patient had difficulty walking 1 foot in step of the other.  Patient did not have any dizziness with closing eyes, neck rotation, or head back.    Annual Wellness Visit Requirements Recorded Today In  Medical, family, social history Past Medical, Family, Social History Section  Current providers Care team  Current medications Medications  Wt, BP, Ht, BMI Vital signs  Hearing assessment (welcome visit) declined  Tobacco, alcohol, illicit drug use History  ADL Nurse Assessment  Depression Screening Nurse Assessment  Cognitive impairment Nurse Assessment  Mini Mental Status Document Flowsheet  Fall Risk Nurse Assessment  Home Safety Progress Note  End of Life Planning (welcome visit) Social Documentation  Medicare preventative services Progress  Note  Risk factors/conditions needing evaluation/treatment Progress Note  Personalized health advice Patient Instructions, goals, letter  Diet & Exercise Social Documentation  Emergency Contact Social Documentation  Seat Belts Social Documentation  Sun exposure/protection Social Documentation    Medicare Prevention Plan: Recommended patient discuss ultra sound with PCP and contact pharmacy for shingles vaccine.   Recommended Medicare Prevention Screenings Men over 52 Test For Frequency Date of Last- BOLD if needed  Colorectal Cancer 1-10 yrs Declined- hemoccult cards  Prostate Cancer Never or yearly 5/08  Aortic Aneurysm Once if 65-75 with hx of smoking Discuss with PCP  Cholesterol 5 yrs 8/10  Diabetes yearly Non diabetic  HIV yearly declined  Influenza Shot yearly 9/11-[facility]  Pneumonia Shot once 8/10  Zostavax Shot once recommended

## 2011-01-08 ENCOUNTER — Encounter: Payer: Self-pay | Admitting: Home Health Services

## 2011-01-08 NOTE — Progress Notes (Signed)
  Subjective:    Patient ID: Shane Mathews, male    DOB: [birth year], 70 y.o.   MRN: [id number]  HPI I have reviewed this visit and discussed with Arlys John and agree with her documentation.       Review of Systems     Objective:   Physical Exam        Assessment & Plan:

## 2011-01-15 ENCOUNTER — Ambulatory Visit (INDEPENDENT_AMBULATORY_CARE_PROVIDER_SITE_OTHER): Payer: Medicare Other | Admitting: Family Medicine

## 2011-01-15 VITALS — BP 210/88 | HR 60 | Resp 12 | Ht 72.0 in | Wt 294.2 lb

## 2011-01-15 DIAGNOSIS — E781 Pure hyperglyceridemia: Secondary | ICD-10-CM

## 2011-01-15 DIAGNOSIS — E669 Obesity, unspecified: Secondary | ICD-10-CM

## 2011-01-15 DIAGNOSIS — I10 Essential (primary) hypertension: Secondary | ICD-10-CM

## 2011-01-15 MED ORDER — METHYLDOPA 250 MG PO TABS: 250.0000 mg | ORAL_TABLET | Freq: Three times a day (TID) | ORAL | Status: DC

## 2011-01-15 NOTE — Progress Notes (Signed)
  Subjective:    Patient ID: Shane Mathews, male    DOB: 07/20/1941, 70 y.o.   MRN: [id number]  HPI Seen recently for medicare annual.  High BP noted.  He states his BPs at home generally run 160/80.  BP here quite high.  He is compliant with all meds.  He seens ophth regularly, recent cataract surg.  No background hypertensive retinopathy reported.  Also has normal creat despite years of apparently poorly controled BP.  Never CVA or MI.    Review of Systems     Objective:   Physical Exam BP confirmed. Lungs clear Cardiac rrr without Murmur No peripheral edema       Assessment & Plan:

## 2011-01-15 NOTE — Patient Instructions (Signed)
Return for fasting blood work.  I will call a day or two later with results. The nurse will set you up for an echocardiogram to check to see if blood pressure has caused heart damage. New blood pressure med at drugstore. We will talk on phone but likely I will want to see you again in about a month to make sure BP coming down.

## 2011-01-16 ENCOUNTER — Telehealth: Payer: Self-pay | Admitting: Family Medicine

## 2011-01-16 NOTE — Telephone Encounter (Signed)
Told pt about appt for 05.16.2012 @ 1000 AM for echo at [facility]. Pt informed to report to 1st floor admissions at 945 am to register pt agreed.Laureen Ochs, Viann Shove

## 2011-01-17 ENCOUNTER — Encounter: Payer: Self-pay | Admitting: Family Medicine

## 2011-01-17 NOTE — Assessment & Plan Note (Signed)
Some recent wt loss.  He seems to be serious about this now.

## 2011-01-17 NOTE — Assessment & Plan Note (Signed)
Recheck fasting labs.  

## 2011-01-17 NOTE — Assessment & Plan Note (Signed)
I think he does have two elements: 1. Element of poorly controled BP 2. Element of [place] hypertension Will add aldomet and check echo to see if any LVH.

## 2011-01-21 ENCOUNTER — Other Ambulatory Visit: Payer: Medicare Other

## 2011-01-21 DIAGNOSIS — E781 Pure hyperglyceridemia: Secondary | ICD-10-CM

## 2011-01-21 DIAGNOSIS — I10 Essential (primary) hypertension: Secondary | ICD-10-CM

## 2011-01-21 LAB — LIPID PANEL
Cholesterol: 200 mg/dL (ref 0–200)
HDL: 42 mg/dL (ref >39)
Triglycerides: 247 mg/dL — ABNORMAL HIGH (ref <150)

## 2011-01-21 NOTE — Progress Notes (Signed)
FLP AND CMP DONE TODAY Shane Mathews 

## 2011-01-22 ENCOUNTER — Ambulatory Visit (HOSPITAL_COMMUNITY): Payer: Medicare Other

## 2011-01-22 ENCOUNTER — Encounter: Payer: Self-pay | Admitting: Family Medicine

## 2011-01-22 LAB — COMPLETE METABOLIC PANEL WITH GFR
Alkaline Phosphatase: 70 U/L (ref 39–117)
BUN: 12 mg/dL (ref 6–23)
CO2: 27 mEq/L (ref 19–32)
Creat: 0.74 mg/dL (ref 0.40–1.50)
GFR, Est African American: >60 mL/min (ref >60)
GFR, Est Non African American: >60 mL/min (ref >60)
Glucose, Bld: 116 mg/dL — ABNORMAL HIGH (ref 70–99)
Total Bilirubin: 0.5 mg/dL (ref 0.3–1.2)

## 2011-02-13 ENCOUNTER — Other Ambulatory Visit: Payer: Self-pay | Admitting: Family Medicine

## 2011-02-13 NOTE — Telephone Encounter (Signed)
Refill request

## 2011-02-19 ENCOUNTER — Ambulatory Visit: Payer: Medicare Other | Admitting: Family Medicine

## 2011-02-26 ENCOUNTER — Other Ambulatory Visit: Payer: Self-pay | Admitting: Family Medicine

## 2011-02-26 DIAGNOSIS — I10 Essential (primary) hypertension: Secondary | ICD-10-CM

## 2011-03-10 ENCOUNTER — Other Ambulatory Visit: Payer: Self-pay | Admitting: Family Medicine

## 2011-03-10 NOTE — Telephone Encounter (Signed)
Refill request

## 2011-03-19 ENCOUNTER — Ambulatory Visit (INDEPENDENT_AMBULATORY_CARE_PROVIDER_SITE_OTHER): Payer: Medicare Other | Admitting: Family Medicine

## 2011-03-19 ENCOUNTER — Encounter: Payer: Self-pay | Admitting: Family Medicine

## 2011-03-19 VITALS — BP 150/72 | HR 80 | Temp 97.6°F | Ht 72.0 in | Wt 283.6 lb

## 2011-03-19 DIAGNOSIS — I10 Essential (primary) hypertension: Secondary | ICD-10-CM

## 2011-03-19 MED ORDER — ZOSTER VACCINE LIVE 19400 UNT/0.65ML ~~LOC~~ SOLR: 0.6500 mL | Freq: Once | SUBCUTANEOUS | Status: DC

## 2011-03-19 NOTE — Assessment & Plan Note (Signed)
Well controled. 

## 2011-03-19 NOTE — Progress Notes (Signed)
  Subjective:    Patient ID: Shane Mathews, male    DOB: 1941-07-15, 70 y.o.   MRN: [id number]  HPI  It has been a great two months for Shane Mathews.  Gout not bothering him so he has been more active.  Eating healthier.  Has lost 11 pounds.  Did not get echo.      Review of Systems Denies chest pain, SOB, syncope     Objective:   Physical Exam Lungs clear. Cardiac RRR Ext no edemal       Assessment & Plan:

## 2011-03-24 ENCOUNTER — Other Ambulatory Visit: Payer: Self-pay | Admitting: Family Medicine

## 2011-03-24 NOTE — Telephone Encounter (Signed)
Refill request

## 2011-04-01 ENCOUNTER — Telehealth: Payer: Self-pay | Admitting: Home Health Services

## 2011-04-01 NOTE — Telephone Encounter (Signed)
Reviewed and appreciate Rosalita Chessman entering zostavax in health maintenance.

## 2011-04-01 NOTE — Telephone Encounter (Signed)
Spoke with Ethelene Browns.   Fu concerning his weight loss and exercise.   Pt reported feeling better and pleased with his weight loss. Pt report that has continued to walk several times a week and that his gout is feeling much better.  Pt is highly motivated for behavior changes and recognizes the improvement in his health.    I celebrated and encouraged pt to continue on his diet/exercise modification and that if he needed any support to contact [facility].   Pt reported getting zostavax vaccine on 7/19.

## 2011-04-08 ENCOUNTER — Other Ambulatory Visit: Payer: Self-pay | Admitting: Family Medicine

## 2011-04-08 NOTE — Telephone Encounter (Signed)
Refill request

## 2011-05-27 ENCOUNTER — Other Ambulatory Visit: Payer: Self-pay | Admitting: Family Medicine

## 2011-05-27 NOTE — Telephone Encounter (Signed)
Refill request

## 2011-08-25 ENCOUNTER — Other Ambulatory Visit: Payer: Self-pay | Admitting: Family Medicine

## 2011-08-25 NOTE — Telephone Encounter (Signed)
Refill request

## 2011-11-23 ENCOUNTER — Other Ambulatory Visit: Payer: Self-pay | Admitting: Family Medicine

## 2011-11-23 NOTE — Telephone Encounter (Signed)
Refill request

## 2011-11-24 ENCOUNTER — Other Ambulatory Visit: Payer: Self-pay | Admitting: Family Medicine

## 2011-11-24 DIAGNOSIS — I10 Essential (primary) hypertension: Secondary | ICD-10-CM

## 2011-11-24 MED ORDER — TERAZOSIN HCL 10 MG PO CAPS: 10.0000 mg | ORAL_CAPSULE | Freq: Every day | ORAL | Status: DC

## 2011-11-24 NOTE — Assessment & Plan Note (Signed)
Refill per fax request 

## 2012-01-07 ENCOUNTER — Other Ambulatory Visit: Payer: Self-pay | Admitting: Family Medicine

## 2012-01-08 ENCOUNTER — Encounter: Payer: Self-pay | Admitting: Home Health Services

## 2012-02-07 ENCOUNTER — Other Ambulatory Visit: Payer: Self-pay | Admitting: Family Medicine

## 2012-03-01 ENCOUNTER — Other Ambulatory Visit: Payer: Self-pay | Admitting: Family Medicine

## 2012-03-04 ENCOUNTER — Other Ambulatory Visit: Payer: Self-pay | Admitting: Family Medicine

## 2012-04-01 ENCOUNTER — Other Ambulatory Visit: Payer: Self-pay | Admitting: Family Medicine

## 2012-04-30 ENCOUNTER — Telehealth: Payer: Self-pay | Admitting: Family Medicine

## 2012-04-30 NOTE — Telephone Encounter (Signed)
Left vm for pt to return call, pt is eligible for free 30 min f/u appt, sent letter to pt.

## 2012-05-06 ENCOUNTER — Other Ambulatory Visit: Payer: Self-pay | Admitting: Family Medicine

## 2012-05-21 ENCOUNTER — Other Ambulatory Visit: Payer: Self-pay | Admitting: Family Medicine

## 2012-08-06 ENCOUNTER — Other Ambulatory Visit: Payer: Self-pay | Admitting: Family Medicine

## 2012-10-19 ENCOUNTER — Telehealth: Payer: Self-pay | Admitting: *Deleted

## 2012-10-19 NOTE — Telephone Encounter (Signed)
I will not complete this prior auth until patient is seen.  He has severe hypertension and it has been 1.5 years since his last appointment.

## 2012-10-19 NOTE — Telephone Encounter (Signed)
PA required for methyldopa. Form placed in MD box.

## 2012-10-20 NOTE — Telephone Encounter (Signed)
Message  from Dr.Hensel left for patient on voicemail.

## 2012-10-20 NOTE — Telephone Encounter (Signed)
Also contacted pharmacy to advise patient that he will need appointment before PA form can be filled out.

## 2012-10-25 ENCOUNTER — Other Ambulatory Visit: Payer: Self-pay | Admitting: Family Medicine

## 2012-10-25 MED ORDER — METHYLDOPA 250 MG PO TABS: 250.0000 mg | ORAL_TABLET | Freq: Three times a day (TID) | ORAL | Status: DC

## 2012-10-25 NOTE — Telephone Encounter (Signed)
I have been reluctant to refill meds due to long interval between appointments.  Called patient.  He has an appointment for next month which he promises to keep.  Will refill aldomet per pharm fax request.

## 2012-11-24 ENCOUNTER — Other Ambulatory Visit: Payer: Self-pay | Admitting: *Deleted

## 2012-11-24 DIAGNOSIS — I10 Essential (primary) hypertension: Secondary | ICD-10-CM

## 2012-11-25 MED ORDER — TERAZOSIN HCL 10 MG PO CAPS: 10.0000 mg | ORAL_CAPSULE | Freq: Every day | ORAL | Status: DC

## 2012-11-26 ENCOUNTER — Ambulatory Visit (HOSPITAL_COMMUNITY)
Admission: RE | Admit: 2012-11-26 | Discharge: 2012-11-26 | Disposition: A | Discharge status: Home / Self Care | Payer: Medicare Other | Source: Ambulatory Visit | Attending: Family Medicine | Admitting: Family Medicine

## 2012-11-26 ENCOUNTER — Ambulatory Visit (INDEPENDENT_AMBULATORY_CARE_PROVIDER_SITE_OTHER): Payer: Medicare Other | Admitting: Family Medicine

## 2012-11-26 ENCOUNTER — Encounter: Payer: Self-pay | Admitting: Family Medicine

## 2012-11-26 VITALS — BP 158/62 | HR 73 | Temp 99.2°F | Ht 72.0 in | Wt 285.0 lb

## 2012-11-26 DIAGNOSIS — I10 Essential (primary) hypertension: Secondary | ICD-10-CM | POA: Insufficient documentation

## 2012-11-26 DIAGNOSIS — F410 Panic disorder [episodic paroxysmal anxiety] without agoraphobia: Secondary | ICD-10-CM

## 2012-11-26 DIAGNOSIS — I4510 Unspecified right bundle-branch block: Secondary | ICD-10-CM | POA: Insufficient documentation

## 2012-11-26 DIAGNOSIS — E669 Obesity, unspecified: Secondary | ICD-10-CM

## 2012-11-26 DIAGNOSIS — E781 Pure hyperglyceridemia: Secondary | ICD-10-CM

## 2012-11-26 DIAGNOSIS — I440 Atrioventricular block, first degree: Secondary | ICD-10-CM | POA: Insufficient documentation

## 2012-11-26 NOTE — Assessment & Plan Note (Signed)
At or near goal for isolated systolic hypertension.  No symptoms.  No LVH on EKG.  Check renal function.

## 2012-11-26 NOTE — Assessment & Plan Note (Signed)
None recently.  Will do slow wean off paxil

## 2012-11-26 NOTE — Assessment & Plan Note (Signed)
His main modifiable risk factor.  Real focus on wt loss.

## 2012-11-26 NOTE — Patient Instructions (Addendum)
Weight loss is your main modifiable risk factor.  Please get serious. Come back for fasting blood work. The goal for your high blood pressure is to keep the top number 160 or below.

## 2012-11-26 NOTE — Assessment & Plan Note (Signed)
Check fasting labs 

## 2012-11-26 NOTE — Progress Notes (Signed)
  Subjective:    Patient ID: Shane Mathews, male    DOB: 09-May-1941, 72 y.o.   MRN: [id number]  HPI  FU severe hypertension.  Has not been seen for a while.  States taking all meds.  He is active and recently cut up a bunch of trees in his yard without symptoms.  Denies CP or SOB.  Follows BP at home and tends to run 140-160 systolic.       Review of Systems     Objective:   Physical ExamLungs clear Cardiac RRR without m or g.  Huston Foley. No peripheral edema Wt noted.        Assessment & Plan:

## 2012-11-30 ENCOUNTER — Other Ambulatory Visit: Payer: Medicare Other

## 2012-11-30 DIAGNOSIS — F410 Panic disorder [episodic paroxysmal anxiety] without agoraphobia: Secondary | ICD-10-CM

## 2012-11-30 DIAGNOSIS — E669 Obesity, unspecified: Secondary | ICD-10-CM

## 2012-11-30 DIAGNOSIS — I10 Essential (primary) hypertension: Secondary | ICD-10-CM

## 2012-11-30 DIAGNOSIS — E781 Pure hyperglyceridemia: Secondary | ICD-10-CM

## 2012-11-30 LAB — COMPLETE METABOLIC PANEL WITH GFR
ALT: 20 U/L (ref 0–53)
Albumin: 4 g/dL (ref 3.5–5.2)
CO2: 30 mEq/L (ref 19–32)
Calcium: 9.1 mg/dL (ref 8.4–10.5)
Chloride: 102 mEq/L (ref 96–112)
GFR, Est African American: >89 mL/min
Potassium: 3.6 mEq/L (ref 3.5–5.3)
Sodium: 141 mEq/L (ref 135–145)
Total Protein: 6.3 g/dL (ref 6.0–8.3)

## 2012-11-30 LAB — LIPID PANEL: LDL Cholesterol: 135 mg/dL — ABNORMAL HIGH (ref 0–99)

## 2012-11-30 NOTE — Progress Notes (Signed)
CMP AND FLP DONE TODAY M,ARCI Sharonda Llamas

## 2012-12-01 ENCOUNTER — Encounter: Payer: Self-pay | Admitting: Family Medicine

## 2012-12-01 ENCOUNTER — Other Ambulatory Visit: Payer: Self-pay | Admitting: Family Medicine

## 2012-12-01 DIAGNOSIS — E781 Pure hyperglyceridemia: Secondary | ICD-10-CM

## 2012-12-01 DIAGNOSIS — E7800 Pure hypercholesterolemia, unspecified: Secondary | ICD-10-CM

## 2012-12-01 MED ORDER — PRAVASTATIN SODIUM 40 MG PO TABS: 40.0000 mg | ORAL_TABLET | Freq: Every day | ORAL | Status: DC

## 2012-12-01 NOTE — Progress Notes (Signed)
Patient ID: Shane Mathews, male   DOB: 1940/10/18, 72 y.o.   MRN: [id number] Called and left message to return for blood work (A1C) and to start statin.  Also will send results letter.

## 2012-12-07 ENCOUNTER — Telehealth: Payer: Self-pay | Admitting: Family Medicine

## 2012-12-07 DIAGNOSIS — E781 Pure hyperglyceridemia: Secondary | ICD-10-CM

## 2012-12-07 NOTE — Telephone Encounter (Signed)
Patient called because he received the letter about his lab results and that he needed to be screened for diabetes and has scheduled to come in first thing on 4/8 but the orders for those labs need to be entered.

## 2012-12-07 NOTE — Telephone Encounter (Signed)
I believe there is already a future order for a POCT HgbA1C.

## 2012-12-14 ENCOUNTER — Encounter: Payer: Self-pay | Admitting: Family Medicine

## 2012-12-14 ENCOUNTER — Other Ambulatory Visit (INDEPENDENT_AMBULATORY_CARE_PROVIDER_SITE_OTHER): Payer: Medicare Other

## 2012-12-14 DIAGNOSIS — E781 Pure hyperglyceridemia: Secondary | ICD-10-CM

## 2012-12-14 NOTE — Progress Notes (Signed)
A1C DONE TODAY Shane Mathews

## 2013-02-11 ENCOUNTER — Other Ambulatory Visit: Payer: Self-pay | Admitting: Family Medicine

## 2013-02-24 ENCOUNTER — Other Ambulatory Visit: Payer: Self-pay | Admitting: Family Medicine

## 2013-03-14 ENCOUNTER — Other Ambulatory Visit: Payer: Self-pay | Admitting: Family Medicine

## 2013-03-18 ENCOUNTER — Emergency Department (HOSPITAL_COMMUNITY)
Admission: EM | Admit: 2013-03-18 | Discharge: 2013-03-18 | Disposition: A | Discharge status: Home / Self Care | Payer: Medicare Other | Attending: Emergency Medicine | Admitting: Emergency Medicine

## 2013-03-18 ENCOUNTER — Encounter (HOSPITAL_COMMUNITY): Payer: Self-pay | Admitting: Emergency Medicine

## 2013-03-18 DIAGNOSIS — I10 Essential (primary) hypertension: Secondary | ICD-10-CM | POA: Insufficient documentation

## 2013-03-18 DIAGNOSIS — Z79899 Other long term (current) drug therapy: Secondary | ICD-10-CM | POA: Insufficient documentation

## 2013-03-18 DIAGNOSIS — Z8669 Personal history of other diseases of the nervous system and sense organs: Secondary | ICD-10-CM | POA: Insufficient documentation

## 2013-03-18 DIAGNOSIS — Z7982 Long term (current) use of aspirin: Secondary | ICD-10-CM | POA: Insufficient documentation

## 2013-03-18 DIAGNOSIS — Z87891 Personal history of nicotine dependence: Secondary | ICD-10-CM | POA: Insufficient documentation

## 2013-03-18 DIAGNOSIS — M109 Gout, unspecified: Secondary | ICD-10-CM | POA: Insufficient documentation

## 2013-03-18 HISTORY — DX: Gout, unspecified: M10.9

## 2013-03-18 MED ORDER — NAPROXEN 250 MG PO TABS: 250.0000 mg | ORAL_TABLET | Freq: Two times a day (BID) | ORAL | Status: DC

## 2013-03-18 MED ORDER — NAPROXEN 500 MG PO TABS
250.0000 mg | ORAL_TABLET | Freq: Once | ORAL | Status: AC
  Administered 2013-03-18: 250 mg via ORAL
  Filled 2013-03-18: qty 1

## 2013-03-18 NOTE — ED Notes (Addendum)
Patient with left hand/wrist swollen and painful. Hx gout. Patient states that when he eats shellfish he seems to have a gout attack. Says that he currently has gout in his right foot but it is going away. Noticed the gout in foot before he noticed it in his hand. It was approx 5 days after eating the shellfish that he noticed his wrist.

## 2013-03-18 NOTE — ED Provider Notes (Addendum)
History    CSN: [id number] Arrival date & time 03/18/13  0810  First MD Initiated Contact with Patient 03/18/13 [phone]     Chief Complaint  Patient presents with  . Wrist Pain   (Consider location/radiation/quality/duration/timing/severity/associated sxs/prior Treatment) Patient is a 72 y.o. male presenting with wrist pain.  Wrist Pain This is a new problem. The current episode started more than 2 days ago. The problem occurs constantly. The problem has not changed since onset.Pertinent negatives include no chest pain, no abdominal pain and no shortness of breath. Exacerbated by: moving joint. Nothing relieves the symptoms. Treatments tried: daily gout suppressive therapy. The treatment provided no relief.   Past Medical History  Diagnosis Date  . Hypertension   . Cataract   . Gout    Past Surgical History  Procedure Laterality Date  . Knee cartilage surgery      left   Family History  Problem Relation Age of Onset  . Osteoporosis Mother   . Cancer Mother    History  Substance Use Topics  . Smoking status: Former Smoker    Quit date: 01/07/1991  . Smokeless tobacco: Never Used  . Alcohol Use: No    Review of Systems  Constitutional: Negative for fever.  Respiratory: Negative for cough and shortness of breath.   Cardiovascular: Negative for chest pain.  Gastrointestinal: Negative for nausea, vomiting, abdominal pain and diarrhea.  All other systems reviewed and are negative.    Allergies  Allopurinol  Home Medications   Current Outpatient Rx  Name  Route  Sig  Dispense  Refill  . amLODipine (NORVASC) 10 MG tablet      TAKE 1 TABLET DAILY   90 tablet   0   . aspirin 81 MG EC tablet   Oral   Take 81 mg by mouth daily.           Marland Kitchen atenolol (TENORMIN) 100 MG tablet      TAKE 1 TABLET BY MOUTH TWICE A DAY   180 tablet   3   . COLCRYS 0.6 MG tablet      TAKE 1 TABLET DAILY TO PREVENT GOUT ATTACK   90 tablet   3   . enalapril (VASOTEC) 20 MG  tablet      take 1 tablet by mouth twice a day   180 tablet   3   . hydrochlorothiazide (HYDRODIURIL) 25 MG tablet      take 1 tablet by mouth once daily   90 tablet   3     Refill Approved   . methyldopa (ALDOMET) 250 MG tablet   Oral   Take 1 tablet (250 mg total) by mouth 3 (three) times daily.   270 tablet   3     In response to your fax request   . multivitamin-iron-minerals-folic acid (CENTRUM) chewable tablet   Oral   Chew 1 tablet by mouth daily.           Marland Kitchen PARoxetine (PAXIL) 20 MG tablet      TAKE 1 TABLET BY MOUTH ONCE DAILY   90 tablet   3   . pravastatin (PRAVACHOL) 40 MG tablet   Oral   Take 1 tablet (40 mg total) by mouth daily.   90 tablet   3   . terazosin (HYTRIN) 10 MG capsule   Oral   Take 1 capsule (10 mg total) by mouth at bedtime.   90 capsule   3    BP 213/66  Pulse 73  Temp(Src) 98.1 F (36.7 C) (Oral)  Resp 18  SpO2 98% Physical Exam  Constitutional: He is oriented to person, place, and time. He appears well-developed and well-nourished. No distress.  HENT:  Head: Normocephalic and atraumatic.  Eyes: Conjunctivae are normal. No scleral icterus.  Neck: Neck supple.  Cardiovascular: Normal rate and intact distal pulses.   No murmur heard. Pulmonary/Chest: Effort normal. No stridor. No respiratory distress. He has no rales.  Abdominal: Normal appearance. He exhibits no distension.  Musculoskeletal:       Left wrist: He exhibits tenderness, swelling and effusion. He exhibits normal range of motion, no deformity and no laceration.  Neurological: He is alert and oriented to person, place, and time.  Skin: Skin is warm and dry. No rash noted.  Psychiatric: He has a normal mood and affect. His behavior is normal.    ED Course  Procedures (including critical care time) Labs Reviewed - No data to display No results found. 1. Gout attack     MDM  71 yo male with left wrist pain and swelling for several days.  He has a  history of gout and states this feels similar to that, except that it is his wrist affected, whereas he usually gets attacks in his toes or knee.  No fevers or systemic symptoms.  Left wrist has good ROM but with pain.  Is swollen and slightly warmer compared to other wrist.  Good pulses and distal perfusion/sensation.  Normal strength.  He has colchicine at home, which he does not think has been working as well as it used to for gout flare-ups.  Will treat with naproxen.  I do not think he has a septic wrist and all signs are pointing to a gout flare up.  He was given strict return precautions for worsening pain, swelling, warmth, or fevers.    He is also hypertensive.  He did not take his antihypertensives yet today, but will take them as soon as discharged . He has no signs/symptoms of end organ damage.  He also claims to have "white coat" hypertension and reports that his BPs are typically elevated when he goes to his doctor's office.    Candyce Churn, MD 03/18/13 7829  Candyce Churn, MD 03/18/13 [phone]

## 2013-03-18 NOTE — ED Notes (Signed)
Pt states he has been having L wrist pain for past several days, denies injury. Redness noted on L wrist.  PMH of gout.

## 2013-03-28 ENCOUNTER — Other Ambulatory Visit: Payer: Self-pay | Admitting: Family Medicine

## 2013-05-24 ENCOUNTER — Other Ambulatory Visit: Payer: Self-pay | Admitting: Family Medicine

## 2013-07-14 ENCOUNTER — Other Ambulatory Visit: Payer: Self-pay | Admitting: Family Medicine

## 2013-08-02 ENCOUNTER — Other Ambulatory Visit: Payer: Self-pay | Admitting: Family Medicine

## 2013-11-23 ENCOUNTER — Other Ambulatory Visit: Payer: Self-pay | Admitting: Family Medicine

## 2013-12-19 ENCOUNTER — Other Ambulatory Visit: Payer: Self-pay | Admitting: Family Medicine

## 2013-12-20 NOTE — Telephone Encounter (Signed)
[facility] for Methy[facility]opa 250 mg tablet. Formulary and PA form placed in provider box for completion. Clovis Puamika L Judaea Burgoon, RN

## 2013-12-23 ENCOUNTER — Encounter: Payer: Self-pay | Admitting: *Deleted

## 2013-12-23 NOTE — Progress Notes (Signed)
[facility] for Methyldopa 250 mg tablet.  PA completed by PCP and faxed to [facility] [place] for review.  Clovis Puamika L Cleaven Demario, RN

## 2013-12-26 NOTE — Progress Notes (Signed)
PA for Methyldopa 250 mg Tablet has been approved from 12/22/2013 - 12/23/2014.  [facility] notified.  Clovis Puamika L Celicia Minahan, RN

## 2014-02-25 ENCOUNTER — Other Ambulatory Visit: Payer: Self-pay | Admitting: Family Medicine

## 2014-02-27 ENCOUNTER — Other Ambulatory Visit: Payer: Self-pay | Admitting: Family Medicine

## 2014-02-27 ENCOUNTER — Telehealth: Payer: Self-pay | Admitting: *Deleted

## 2014-02-27 DIAGNOSIS — E781 Pure hyperglyceridemia: Secondary | ICD-10-CM

## 2014-02-27 DIAGNOSIS — I10 Essential (primary) hypertension: Secondary | ICD-10-CM

## 2014-02-27 MED ORDER — TERAZOSIN HCL 10 MG PO CAPS: 10.0000 mg | ORAL_CAPSULE | Freq: Every day | ORAL | Status: DC

## 2014-02-27 MED ORDER — PRAVASTATIN SODIUM 40 MG PO TABS: 40.0000 mg | ORAL_TABLET | Freq: Every day | ORAL | Status: DC

## 2014-02-27 MED ORDER — METHYLDOPA 250 MG PO TABS: 250.0000 mg | ORAL_TABLET | Freq: Three times a day (TID) | ORAL | Status: DC

## 2014-02-27 NOTE — Telephone Encounter (Signed)
Agree as we discussed

## 2014-02-27 NOTE — Telephone Encounter (Signed)
Pt called and stated he is out of medications:methyldopa, terazosin and pravastatin.  Pt has appt on 03/15/2014.  Medication refilled completed per verbal order given by Dr. Leveda AnnaHensel.  [name], Tamika L, RN

## 2014-03-14 ENCOUNTER — Other Ambulatory Visit: Payer: Self-pay | Admitting: Family Medicine

## 2014-03-15 ENCOUNTER — Ambulatory Visit (INDEPENDENT_AMBULATORY_CARE_PROVIDER_SITE_OTHER): Payer: Medicare Other | Admitting: Family Medicine

## 2014-03-15 ENCOUNTER — Encounter: Payer: Self-pay | Admitting: Family Medicine

## 2014-03-15 VITALS — BP 170/68 | HR 77 | Temp 98.6°F | Wt 284.0 lb

## 2014-03-15 DIAGNOSIS — I10 Essential (primary) hypertension: Secondary | ICD-10-CM

## 2014-03-15 DIAGNOSIS — E781 Pure hyperglyceridemia: Secondary | ICD-10-CM

## 2014-03-15 DIAGNOSIS — I872 Venous insufficiency (chronic) (peripheral): Secondary | ICD-10-CM

## 2014-03-15 DIAGNOSIS — Z23 Encounter for immunization: Secondary | ICD-10-CM

## 2014-03-15 DIAGNOSIS — Z1211 Encounter for screening for malignant neoplasm of colon: Secondary | ICD-10-CM

## 2014-03-15 DIAGNOSIS — E7800 Pure hypercholesterolemia, unspecified: Secondary | ICD-10-CM

## 2014-03-15 MED ORDER — TRIAMCINOLONE ACETONIDE 0.1 % EX OINT: 1.0000 "application " | TOPICAL_OINTMENT | Freq: Two times a day (BID) | CUTANEOUS | Status: DC

## 2014-03-15 NOTE — Patient Instructions (Addendum)
Make sure you do the hemocult cards. Get the fasting blood work done and I will call with results Check your blood pressures at home three times per week for 3 weeks, write them down and send me the record) Be sure to check your medications with this list to make sure nothing has dropped off. I may need to switch you to a more potent cholesterol lowering medication.  If I don't mention it when I call you with the blood results remind. I will send in a steroid cream for your foot.  If it does not clear it up in two weeks, get back to us for something called an unna boot. You will also get the new, second pneumonia vaccine today.  Prevnar. Because you now have mild iron deficiency anemia, you need a colonoscopy.

## 2014-03-16 ENCOUNTER — Other Ambulatory Visit: Payer: Medicare Other

## 2014-03-16 DIAGNOSIS — D509 Iron deficiency anemia, unspecified: Secondary | ICD-10-CM

## 2014-03-16 DIAGNOSIS — I10 Essential (primary) hypertension: Secondary | ICD-10-CM

## 2014-03-16 LAB — LIPID PANEL
Cholesterol: 148 mg/dL (ref 0–200)
HDL: 40 mg/dL (ref >39)
LDL Cholesterol: 80 mg/dL (ref 0–99)
Total CHOL/HDL Ratio: 3.7 Ratio
Triglycerides: 140 mg/dL (ref <150)
VLDL: 28 mg/dL (ref 0–40)

## 2014-03-16 LAB — CBC
HCT: 37.3 % — ABNORMAL LOW (ref 39.0–52.0)
Hemoglobin: 12.7 g/dL — ABNORMAL LOW (ref 13.0–17.0)
MCH: 24.5 pg — AB (ref 26.0–34.0)
MCHC: 34 g/dL (ref 30.0–36.0)
MCV: 71.9 fL — ABNORMAL LOW (ref 78.0–100.0)
PLATELETS: 226 10*3/uL (ref 150–400)
RBC: 5.19 MIL/uL (ref 4.22–5.81)
RDW: 16.1 % — ABNORMAL HIGH (ref 11.5–15.5)
WBC: 9.6 10*3/uL (ref 4.0–10.5)

## 2014-03-16 LAB — COMPREHENSIVE METABOLIC PANEL
ALBUMIN: 3.9 g/dL (ref 3.5–5.2)
ALT: 12 U/L (ref 0–53)
AST: 13 U/L (ref 0–37)
Alkaline Phosphatase: 76 U/L (ref 39–117)
BUN: 12 mg/dL (ref 6–23)
CALCIUM: 8.8 mg/dL (ref 8.4–10.5)
CO2: 29 meq/L (ref 19–32)
Chloride: 102 mEq/L (ref 96–112)
Creat: 0.76 mg/dL (ref 0.50–1.35)
Glucose, Bld: 112 mg/dL — ABNORMAL HIGH (ref 70–99)
Potassium: 3.7 mEq/L (ref 3.5–5.3)
SODIUM: 140 meq/L (ref 135–145)
TOTAL PROTEIN: 6.1 g/dL (ref 6.0–8.3)
Total Bilirubin: 0.4 mg/dL (ref 0.2–1.2)

## 2014-03-16 NOTE — Progress Notes (Signed)
   Subjective:    Patient ID: Shane Mathews, male    DOB: [birth year], 73 y.o.   MRN: [id number]  HPI  More a recheck of hypertension than annual exam.  Shane Mathews has not been in for over a year despite severe HBP requiring 6 antihypertensive meds to control.  He is asymptomatic.  Never had stroke or CVA.  Last labs, >1 year ago, show no renal dysfunction.  Denies chest pain, shortness of breath, neurologic symptoms or leg swelling.  Also denies lightheadedness.   Patient reports he takes his blood pressure at home and it has been running 140-150 systolic with low diastolics.  Also complains on rash on foot/ankle for weeks.      Review of Systems     Objective:   Physical Exam Lungs clear Cardiac RRR with 1/6 SEM.   Ext rash on dorsum of foot and medial ankle consistent with either contact dermatitis or chronic venous insufficiency.  Does not appear infected.         Assessment & Plan:

## 2014-03-16 NOTE — Assessment & Plan Note (Signed)
Isolated systolic hypertension.  Unclear control.  Good by home BP reads, bad by my read in office today. Will gather more readings before deciding on med alterations.

## 2014-03-16 NOTE — Assessment & Plan Note (Signed)
Also could be contact dermatitis.  Will start with just topical steroids and if fails to respond treat with unna boot.

## 2014-03-16 NOTE — Assessment & Plan Note (Signed)
Check fasting labs 

## 2014-03-16 NOTE — Progress Notes (Signed)
CMP,CBC AND FLP DONE TODAY Wilbon Obenchain 

## 2014-03-16 NOTE — Assessment & Plan Note (Signed)
Check fasting labs.  Depending on results, consider switch to atorvastatin.

## 2014-03-17 ENCOUNTER — Telehealth: Payer: Self-pay | Admitting: *Deleted

## 2014-03-17 ENCOUNTER — Encounter: Payer: Self-pay | Admitting: Gastroenterology

## 2014-03-17 DIAGNOSIS — D509 Iron deficiency anemia, unspecified: Secondary | ICD-10-CM | POA: Insufficient documentation

## 2014-03-17 NOTE — Addendum Note (Signed)
Addended by: [name], [name] on: 03/17/2014 01:23 PM   Modules accepted: Orders

## 2014-03-17 NOTE — Telephone Encounter (Signed)
Consult for colonoscopy is Wednesday 8/26 @ 9am. He will have the procedure on Wednesday 9/9 @10am  patient [facility] 9 am. [facility] [address] 3[address] floor phone number there is [phone]

## 2014-03-17 NOTE — Progress Notes (Signed)
Patient ID: Shane Mathews, male   DOB: 04/30/1941, 73 y.o.   MRN: [id number] New hypo micro anemia noted: Needs colonoscopy.

## 2014-03-17 NOTE — Assessment & Plan Note (Signed)
Patient with new iron deficiency anemia.  Now I am not satisfied with hemocult for colon cancer screen.  Needs GI referral for colonoscopy.

## 2014-03-20 NOTE — Telephone Encounter (Signed)
Pt called and wanted Dr. Leveda AnnaHensel to know that he was given the stool card and will not need the appointment for consult or procedure. He will be mailing the stool card back today. jw

## 2014-03-20 NOTE — Telephone Encounter (Signed)
Spoke to patient and made clear he needs a colonoscopy.

## 2014-03-21 LAB — POC HEMOCCULT BLD/STL (HOME/3-CARD/SCREEN)
Card #2 Fecal Occult Blod, POC: NEGATIVE
FECAL OCCULT BLD: NEGATIVE
Fecal Occult Blood, POC: NEGATIVE

## 2014-03-21 NOTE — Progress Notes (Signed)
Patient ID: Shane Mathews, male   DOB: October 15, 1940, 73 y.o.   MRN: [id number] Called and informed that hemocults were neg x 3.  Also emphasized that he still needs colonoscopy due to new iron deficiency anemia.

## 2014-03-21 NOTE — Addendum Note (Signed)
Addended by: Jennette BillBUSICK, Tanaysia Bhardwaj L on: 03/21/2014 04:31 PM   Modules accepted: Orders

## 2014-03-21 NOTE — Addendum Note (Signed)
Addended by: Farrell OursEVANS, Navid Lenzen K on: 03/21/2014 04:27 PM   Modules accepted: Orders

## 2014-03-25 ENCOUNTER — Other Ambulatory Visit: Payer: Self-pay | Admitting: Family Medicine

## 2014-03-28 ENCOUNTER — Other Ambulatory Visit: Payer: Self-pay | Admitting: Family Medicine

## 2014-04-14 ENCOUNTER — Other Ambulatory Visit: Payer: Self-pay | Admitting: Family Medicine

## 2014-04-14 MED ORDER — METHYLDOPA 500 MG PO TABS: 500.0000 mg | ORAL_TABLET | Freq: Three times a day (TID) | ORAL | Status: DC

## 2014-04-14 NOTE — Telephone Encounter (Signed)
Brought in a bunch of home BP readings.  All diastolics good.  Systolics range from a low of 136 to a high of 184.  40-50% are above 160.  Will increase methyldopa.  Discussed with patient.

## 2014-05-17 ENCOUNTER — Encounter: Payer: Medicare Other | Admitting: Gastroenterology

## 2014-05-22 ENCOUNTER — Other Ambulatory Visit: Payer: Self-pay | Admitting: Family Medicine

## 2014-07-20 ENCOUNTER — Other Ambulatory Visit: Payer: Self-pay | Admitting: Family Medicine

## 2014-08-01 ENCOUNTER — Other Ambulatory Visit: Payer: Self-pay | Admitting: Family Medicine

## 2014-11-07 ENCOUNTER — Other Ambulatory Visit: Payer: Self-pay | Admitting: *Deleted

## 2014-11-07 MED ORDER — COLCHICINE 0.6 MG PO TABS: ORAL_TABLET | ORAL | Status: DC

## 2014-11-08 ENCOUNTER — Encounter: Payer: Self-pay | Admitting: Family Medicine

## 2014-11-08 NOTE — Progress Notes (Signed)
Prior authorization for colchicine placed in MD box for completion. [facility].

## 2014-11-08 NOTE — Progress Notes (Signed)
Patient ID: Shane Mathews, male   DOB: November 15, 1940, 74 y.o.   MRN: [id number] Form completed.  Allopurinol allergy

## 2014-11-08 NOTE — Progress Notes (Signed)
PA faxed to [facility] [place] for review.  [name], Patrisia Faeth L, RN

## 2014-11-09 NOTE — Progress Notes (Signed)
Called to check status of PA.  PA pending per [facility] [place] Medicare.  [name], Annaleigh Steinmeyer L, RN

## 2014-11-13 NOTE — Progress Notes (Signed)
PA has been approved from [employer] medicare.  Approved until 11/08/2015.  [facility] aware of approval.  Reference number: [id number]. [name], Leighana Neyman L, RN

## 2015-03-01 ENCOUNTER — Telehealth: Payer: Self-pay | Admitting: *Deleted

## 2015-03-01 NOTE — Telephone Encounter (Signed)
Prior Authorization received [facility] for Methyldopa 500 mg. Formulary and PA form placed in provider box for completion. Clovis Pu, RN

## 2015-03-01 NOTE — Telephone Encounter (Signed)
PA form faxed to [employer] of [place] for review.  Martin, Tamika L, RN  

## 2015-03-01 NOTE — Telephone Encounter (Signed)
done

## 2015-03-05 NOTE — Telephone Encounter (Signed)
PA approved for Methyldopa from [employer]BS of [place].  [facility] is aware of approval.  [name], Dillyn Menna L, RN

## 2015-03-13 ENCOUNTER — Other Ambulatory Visit: Payer: Self-pay | Admitting: Family Medicine

## 2015-03-23 ENCOUNTER — Other Ambulatory Visit: Payer: Self-pay | Admitting: Family Medicine

## 2015-04-10 ENCOUNTER — Other Ambulatory Visit: Payer: Self-pay | Admitting: Family Medicine

## 2015-04-22 ENCOUNTER — Other Ambulatory Visit: Payer: Self-pay | Admitting: Family Medicine

## 2015-05-15 ENCOUNTER — Other Ambulatory Visit: Payer: Self-pay | Admitting: Family Medicine

## 2015-05-29 ENCOUNTER — Other Ambulatory Visit: Payer: Self-pay | Admitting: Family Medicine

## 2015-07-16 ENCOUNTER — Other Ambulatory Visit: Payer: Self-pay | Admitting: *Deleted

## 2015-07-17 MED ORDER — PAROXETINE HCL 20 MG PO TABS: 20.0000 mg | ORAL_TABLET | Freq: Every day | ORAL | Status: DC

## 2015-07-31 ENCOUNTER — Other Ambulatory Visit: Payer: Self-pay | Admitting: Family Medicine

## 2015-08-09 ENCOUNTER — Encounter: Payer: Self-pay | Admitting: Family Medicine

## 2015-08-09 ENCOUNTER — Ambulatory Visit (INDEPENDENT_AMBULATORY_CARE_PROVIDER_SITE_OTHER): Payer: Medicare Other | Admitting: Family Medicine

## 2015-08-09 VITALS — BP 168/68 | HR 55 | Temp 98.0°F | Ht 72.0 in | Wt 298.5 lb

## 2015-08-09 DIAGNOSIS — I10 Essential (primary) hypertension: Secondary | ICD-10-CM

## 2015-08-09 DIAGNOSIS — E7800 Pure hypercholesterolemia, unspecified: Secondary | ICD-10-CM

## 2015-08-09 DIAGNOSIS — Z1211 Encounter for screening for malignant neoplasm of colon: Secondary | ICD-10-CM | POA: Diagnosis not present

## 2015-08-09 DIAGNOSIS — I70219 Atherosclerosis of native arteries of extremities with intermittent claudication, unspecified extremity: Secondary | ICD-10-CM | POA: Insufficient documentation

## 2015-08-09 DIAGNOSIS — D509 Iron deficiency anemia, unspecified: Secondary | ICD-10-CM

## 2015-08-09 DIAGNOSIS — I70213 Atherosclerosis of native arteries of extremities with intermittent claudication, bilateral legs: Secondary | ICD-10-CM | POA: Diagnosis not present

## 2015-08-09 NOTE — Patient Instructions (Addendum)
Google "low purine diet"  Or gout diet. I recommend a check by a heart doctor.  I know you said next time.  Let me know if you change mind I will call with the blood test and leg circulation test results. Stay on all the same medicines. Yes, make weight loss a big priority. Come back fasting for your blood.

## 2015-08-10 ENCOUNTER — Other Ambulatory Visit: Payer: Medicare Other

## 2015-08-10 DIAGNOSIS — I10 Essential (primary) hypertension: Secondary | ICD-10-CM

## 2015-08-10 DIAGNOSIS — E7800 Pure hypercholesterolemia, unspecified: Secondary | ICD-10-CM

## 2015-08-10 LAB — COMPREHENSIVE METABOLIC PANEL
ALT: 12 U/L (ref 9–46)
AST: 11 U/L (ref 10–35)
Albumin: 3.5 g/dL — ABNORMAL LOW (ref 3.6–5.1)
Alkaline Phosphatase: 82 U/L (ref 40–115)
BILIRUBIN TOTAL: 0.4 mg/dL (ref 0.2–1.2)
BUN: 12 mg/dL (ref 7–25)
CALCIUM: 8.5 mg/dL — AB (ref 8.6–10.3)
CO2: 29 mmol/L (ref 20–31)
CREATININE: 0.85 mg/dL (ref 0.70–1.18)
Chloride: 99 mmol/L (ref 98–110)
GLUCOSE: 120 mg/dL — AB (ref 65–99)
Potassium: 3.7 mmol/L (ref 3.5–5.3)
SODIUM: 141 mmol/L (ref 135–146)
Total Protein: 6.3 g/dL (ref 6.1–8.1)

## 2015-08-10 LAB — LIPID PANEL
Cholesterol: 180 mg/dL (ref 125–200)
HDL: 40 mg/dL (ref >=40)
LDL CALC: 102 mg/dL (ref <130)
Total CHOL/HDL Ratio: 4.5 Ratio (ref <=5.0)
Triglycerides: 189 mg/dL — ABNORMAL HIGH (ref <150)
VLDL: 38 mg/dL — ABNORMAL HIGH (ref <30)

## 2015-08-10 LAB — CBC
HCT: 38.2 % — ABNORMAL LOW (ref 39.0–52.0)
Hemoglobin: 12.8 g/dL — ABNORMAL LOW (ref 13.0–17.0)
MCH: 26.2 pg (ref 26.0–34.0)
MCHC: 33.5 g/dL (ref 30.0–36.0)
MCV: 78.1 fL (ref 78.0–100.0)
MPV: 9.9 fL (ref 8.6–12.4)
Platelets: 208 10*3/uL (ref 150–400)
RBC: 4.89 MIL/uL (ref 4.22–5.81)
RDW: 14.2 % (ref 11.5–15.5)
WBC: 7.8 10*3/uL (ref 4.0–10.5)

## 2015-08-10 NOTE — Progress Notes (Signed)
Labs done today Shane Mathews 

## 2015-08-10 NOTE — Assessment & Plan Note (Signed)
Control is sub optimal.  I would like to refer to cards for help with BP and likely for stress testing.  He is at very high risk for CVA and MI.  Patient declined referral.   Intense wt management.  FU in 3 months.

## 2015-08-10 NOTE — Assessment & Plan Note (Signed)
Weight gain noted by me and patient.  He vows to buckle down on exercise and diet.

## 2015-08-10 NOTE — Assessment & Plan Note (Signed)
Check CBC and send home again with hemocults x3 which he will accept over colonoscopy.

## 2015-08-10 NOTE — Progress Notes (Signed)
   Subjective:    Patient ID: Fayrene FearingAnthony H Serviss, male    DOB: [birth year], 74 y.o.   MRN: [id number]  HPI Mr. Rupert comes in for recheck of HBP and annual prevent screening.  I have long been concerned by his severe, likely poorly controled HBP.  I believe he is diligent about taking his prescribed meds.  Does check his BP at home with systolics running 140s and 150s (Diastolics never a problem.  Denies CP, SOB, syncope or DOE.  Never had cardiac stress test or echo.  He does have trouble walking.  I tried to pin him down in that he has joint pain and some muscle pain with ambulation.  As best I can tell, history is consistent with claudication.  HBP is the obvious risk factor for PVD. Does have chronic, mild leg swelling and carries dx of venous insufficiency.  Due for labs.  Not fasting today.  Needs FLP due to previous increased triglycerides.     Review of Systems     Objective:   Physical Exam Weight gain noted.  BP confirmed. Neck supple, no JVD Cardiac RRR with 1/6 SEM Lungs clear Abd benign Ext 2 + bilateral edema.  Absent pedal pulses bilaterally.  No skin breakdown.        Assessment & Plan:

## 2015-08-10 NOTE — Assessment & Plan Note (Signed)
Given history consistent with claudication, absent pulses and risk factors, will refer for arterial dopplers.

## 2015-08-10 NOTE — Assessment & Plan Note (Signed)
Get FLP

## 2015-08-16 LAB — POC HEMOCCULT BLD/STL (HOME/3-CARD/SCREEN)
Card #2 Fecal Occult Blod, POC: NEGATIVE
FECAL OCCULT BLD: NEGATIVE
FECAL OCCULT BLD: NEGATIVE

## 2015-08-16 NOTE — Addendum Note (Signed)
Addended by: Jennette BillBUSICK, ROBERT L on: 08/16/2015 08:48 AM   Modules accepted: Orders

## 2015-09-04 ENCOUNTER — Other Ambulatory Visit: Payer: Self-pay | Admitting: Family Medicine

## 2015-10-29 ENCOUNTER — Other Ambulatory Visit: Payer: Self-pay | Admitting: Family Medicine

## 2016-03-06 ENCOUNTER — Other Ambulatory Visit: Payer: Self-pay | Admitting: Family Medicine

## 2016-03-13 ENCOUNTER — Other Ambulatory Visit: Payer: Self-pay | Admitting: Family Medicine

## 2016-03-21 ENCOUNTER — Other Ambulatory Visit: Payer: Self-pay | Admitting: Family Medicine

## 2016-03-25 ENCOUNTER — Other Ambulatory Visit: Payer: Self-pay | Admitting: Family Medicine

## 2016-03-25 ENCOUNTER — Telehealth: Payer: Self-pay | Admitting: *Deleted

## 2016-03-25 NOTE — Telephone Encounter (Signed)
Done

## 2016-03-25 NOTE — Telephone Encounter (Signed)
PA faxed to [employer] [place] for review.  Review process could take 24-72 hours to complete.  [name], Jerrik Housholder L, RN

## 2016-03-25 NOTE — Telephone Encounter (Signed)
[facility] mg.  PA form placed in provider box for completion. [name], Azyah Flett L, RN

## 2016-03-26 NOTE — Telephone Encounter (Signed)
PA was approved via [employer]BS of [place] Medicare until 03/24/2017.  [name], Mickala Laton L, RN

## 2016-05-15 ENCOUNTER — Other Ambulatory Visit: Payer: Self-pay | Admitting: Family Medicine

## 2016-06-20 ENCOUNTER — Other Ambulatory Visit: Payer: Self-pay | Admitting: Family Medicine

## 2016-07-14 ENCOUNTER — Other Ambulatory Visit: Payer: Self-pay | Admitting: Family Medicine

## 2016-07-30 ENCOUNTER — Other Ambulatory Visit: Payer: Self-pay | Admitting: Family Medicine

## 2016-08-11 ENCOUNTER — Telehealth: Payer: Self-pay | Admitting: Family Medicine

## 2016-08-11 NOTE — Telephone Encounter (Signed)
Would like to talk to dr hensel. He is having breathing problem with exertion.

## 2016-08-11 NOTE — Telephone Encounter (Signed)
Per patient request appointment tomorrow morning 08/12/16 at 9:30 AM with Dr. Nadine CountsGottschalk.  [name], Tamika L, RN

## 2016-08-11 NOTE — Telephone Encounter (Signed)
Called.  Having marked SOB with minimal exertion.  No SOB at rest.  With his severe, longstanding hypertension, I am most worried about CHF, but there are multiple other possibilities.  Has appointment with me on Thursday.  He is worried enough that he would like to be seen sooner.  Agree.  Will forward to triage and get him an appointment for tomorrow morning.

## 2016-08-12 ENCOUNTER — Encounter: Payer: Self-pay | Admitting: Family Medicine

## 2016-08-12 ENCOUNTER — Inpatient Hospital Stay (HOSPITAL_COMMUNITY): Payer: Medicare Other

## 2016-08-12 ENCOUNTER — Ambulatory Visit (INDEPENDENT_AMBULATORY_CARE_PROVIDER_SITE_OTHER): Payer: Medicare Other | Admitting: Family Medicine

## 2016-08-12 ENCOUNTER — Inpatient Hospital Stay (HOSPITAL_COMMUNITY)
Admission: AD | Admit: 2016-08-12 | Discharge: 2016-08-21 | DRG: 292 | Disposition: A | Discharge status: Home Health | Payer: Medicare Other | Source: Ambulatory Visit | Attending: Family Medicine | Admitting: Family Medicine

## 2016-08-12 ENCOUNTER — Other Ambulatory Visit: Payer: Self-pay

## 2016-08-12 VITALS — BP 162/52 | HR 44 | Temp 98.5°F | Ht 72.0 in | Wt 309.8 lb

## 2016-08-12 DIAGNOSIS — E785 Hyperlipidemia, unspecified: Secondary | ICD-10-CM | POA: Diagnosis present

## 2016-08-12 DIAGNOSIS — J90 Pleural effusion, not elsewhere classified: Secondary | ICD-10-CM | POA: Diagnosis present

## 2016-08-12 DIAGNOSIS — J690 Pneumonitis due to inhalation of food and vomit: Secondary | ICD-10-CM | POA: Diagnosis not present

## 2016-08-12 DIAGNOSIS — I4891 Unspecified atrial fibrillation: Secondary | ICD-10-CM | POA: Diagnosis not present

## 2016-08-12 DIAGNOSIS — R001 Bradycardia, unspecified: Secondary | ICD-10-CM | POA: Diagnosis present

## 2016-08-12 DIAGNOSIS — R946 Abnormal results of thyroid function studies: Secondary | ICD-10-CM

## 2016-08-12 DIAGNOSIS — I10 Essential (primary) hypertension: Secondary | ICD-10-CM | POA: Diagnosis not present

## 2016-08-12 DIAGNOSIS — I160 Hypertensive urgency: Secondary | ICD-10-CM | POA: Diagnosis not present

## 2016-08-12 DIAGNOSIS — E876 Hypokalemia: Secondary | ICD-10-CM | POA: Diagnosis present

## 2016-08-12 DIAGNOSIS — R609 Edema, unspecified: Secondary | ICD-10-CM | POA: Diagnosis not present

## 2016-08-12 DIAGNOSIS — I872 Venous insufficiency (chronic) (peripheral): Secondary | ICD-10-CM | POA: Diagnosis present

## 2016-08-12 DIAGNOSIS — I70213 Atherosclerosis of native arteries of extremities with intermittent claudication, bilateral legs: Secondary | ICD-10-CM | POA: Diagnosis not present

## 2016-08-12 DIAGNOSIS — F419 Anxiety disorder, unspecified: Secondary | ICD-10-CM | POA: Diagnosis present

## 2016-08-12 DIAGNOSIS — R0600 Dyspnea, unspecified: Secondary | ICD-10-CM | POA: Diagnosis present

## 2016-08-12 DIAGNOSIS — M1000 Idiopathic gout, unspecified site: Secondary | ICD-10-CM | POA: Diagnosis not present

## 2016-08-12 DIAGNOSIS — I5033 Acute on chronic diastolic (congestive) heart failure: Secondary | ICD-10-CM | POA: Diagnosis not present

## 2016-08-12 DIAGNOSIS — M109 Gout, unspecified: Secondary | ICD-10-CM | POA: Diagnosis not present

## 2016-08-12 DIAGNOSIS — M25569 Pain in unspecified knee: Secondary | ICD-10-CM

## 2016-08-12 DIAGNOSIS — G4733 Obstructive sleep apnea (adult) (pediatric): Secondary | ICD-10-CM | POA: Diagnosis present

## 2016-08-12 DIAGNOSIS — M25562 Pain in left knee: Secondary | ICD-10-CM

## 2016-08-12 DIAGNOSIS — R0601 Orthopnea: Secondary | ICD-10-CM

## 2016-08-12 DIAGNOSIS — I481 Persistent atrial fibrillation: Secondary | ICD-10-CM | POA: Diagnosis present

## 2016-08-12 DIAGNOSIS — R0609 Other forms of dyspnea: Secondary | ICD-10-CM

## 2016-08-12 DIAGNOSIS — I1A0 Resistant hypertension: Secondary | ICD-10-CM | POA: Diagnosis present

## 2016-08-12 DIAGNOSIS — I739 Peripheral vascular disease, unspecified: Secondary | ICD-10-CM | POA: Diagnosis present

## 2016-08-12 DIAGNOSIS — E781 Pure hyperglyceridemia: Secondary | ICD-10-CM | POA: Diagnosis present

## 2016-08-12 DIAGNOSIS — R739 Hyperglycemia, unspecified: Secondary | ICD-10-CM | POA: Diagnosis present

## 2016-08-12 DIAGNOSIS — D509 Iron deficiency anemia, unspecified: Secondary | ICD-10-CM | POA: Diagnosis present

## 2016-08-12 DIAGNOSIS — I110 Hypertensive heart disease with heart failure: Principal | ICD-10-CM | POA: Diagnosis present

## 2016-08-12 DIAGNOSIS — I480 Paroxysmal atrial fibrillation: Secondary | ICD-10-CM | POA: Diagnosis not present

## 2016-08-12 DIAGNOSIS — E0590 Thyrotoxicosis, unspecified without thyrotoxic crisis or storm: Secondary | ICD-10-CM | POA: Diagnosis present

## 2016-08-12 DIAGNOSIS — R0989 Other specified symptoms and signs involving the circulatory and respiratory systems: Secondary | ICD-10-CM | POA: Diagnosis present

## 2016-08-12 DIAGNOSIS — K219 Gastro-esophageal reflux disease without esophagitis: Secondary | ICD-10-CM | POA: Diagnosis present

## 2016-08-12 DIAGNOSIS — E2740 Unspecified adrenocortical insufficiency: Secondary | ICD-10-CM | POA: Diagnosis present

## 2016-08-12 DIAGNOSIS — Z87891 Personal history of nicotine dependence: Secondary | ICD-10-CM

## 2016-08-12 DIAGNOSIS — I119 Hypertensive heart disease without heart failure: Secondary | ICD-10-CM | POA: Diagnosis present

## 2016-08-12 DIAGNOSIS — Z6841 Body Mass Index (BMI) 40.0 and over, adult: Secondary | ICD-10-CM

## 2016-08-12 DIAGNOSIS — I5032 Chronic diastolic (congestive) heart failure: Secondary | ICD-10-CM | POA: Diagnosis present

## 2016-08-12 DIAGNOSIS — I4819 Other persistent atrial fibrillation: Secondary | ICD-10-CM | POA: Diagnosis present

## 2016-08-12 HISTORY — DX: Hypertensive heart disease without heart failure: I11.9

## 2016-08-12 HISTORY — DX: Acute on chronic diastolic (congestive) heart failure: I50.33

## 2016-08-12 LAB — CBC
HEMATOCRIT: 34.3 % — AB (ref 39.0–52.0)
HEMATOCRIT: 33.7 % — AB (ref 39.0–52.0)
Hemoglobin: 11 g/dL — ABNORMAL LOW (ref 13.0–17.0)
Hemoglobin: 11.2 g/dL — ABNORMAL LOW (ref 13.0–17.0)
MCH: 24 pg — ABNORMAL LOW (ref 26.0–34.0)
MCH: 24 pg — ABNORMAL LOW (ref 26.0–34.0)
MCHC: 32.7 g/dL (ref 30.0–36.0)
MCHC: 32.6 g/dL (ref 30.0–36.0)
MCV: 73.6 fL — ABNORMAL LOW (ref 78.0–100.0)
MCV: 73.6 fL — ABNORMAL LOW (ref 78.0–100.0)
PLATELETS: 219 10*3/uL (ref 150–400)
Platelets: 217 10*3/uL (ref 150–400)
RBC: 4.58 MIL/uL (ref 4.22–5.81)
RBC: 4.66 MIL/uL (ref 4.22–5.81)
RDW: 15.2 % (ref 11.5–15.5)
RDW: 15.2 % (ref 11.5–15.5)
WBC: 8.8 10*3/uL (ref 4.0–10.5)
WBC: 8.8 10*3/uL (ref 4.0–10.5)

## 2016-08-12 LAB — CREATININE, SERUM
CREATININE: 0.91 mg/dL (ref 0.61–1.24)
GFR calc Af Amer: >60 mL/min (ref >60)

## 2016-08-12 LAB — LIPID PANEL
Cholesterol: 99 mg/dL (ref 0–200)
HDL: 36 mg/dL — AB (ref >40)
LDL CALC: 36 mg/dL (ref 0–99)
TRIGLYCERIDES: 137 mg/dL (ref <150)
Total CHOL/HDL Ratio: 2.8 RATIO
VLDL: 27 mg/dL (ref 0–40)

## 2016-08-12 LAB — COMPREHENSIVE METABOLIC PANEL
ALBUMIN: 3.1 g/dL — AB (ref 3.5–5.0)
ALK PHOS: 77 U/L (ref 38–126)
ALT: 14 U/L — AB (ref 17–63)
AST: 16 U/L (ref 15–41)
Anion gap: 5 (ref 5–15)
BILIRUBIN TOTAL: 0.7 mg/dL (ref 0.3–1.2)
BUN: 10 mg/dL (ref 6–20)
CALCIUM: 8.3 mg/dL — AB (ref 8.9–10.3)
CO2: 33 mmol/L — ABNORMAL HIGH (ref 22–32)
CREATININE: 0.94 mg/dL (ref 0.61–1.24)
Chloride: 96 mmol/L — ABNORMAL LOW (ref 101–111)
GFR calc Af Amer: >60 mL/min (ref >60)
GLUCOSE: 148 mg/dL — AB (ref 65–99)
POTASSIUM: 2.8 mmol/L — AB (ref 3.5–5.1)
Sodium: 134 mmol/L — ABNORMAL LOW (ref 135–145)
TOTAL PROTEIN: 5.8 g/dL — AB (ref 6.5–8.1)

## 2016-08-12 LAB — TROPONIN I: Troponin I: <0.03 ng/mL (ref <0.03)

## 2016-08-12 LAB — BRAIN NATRIURETIC PEPTIDE: B Natriuretic Peptide: 498.9 pg/mL — ABNORMAL HIGH (ref 0.0–100.0)

## 2016-08-12 MED ORDER — TRIAMCINOLONE ACETONIDE 0.1 % EX OINT
TOPICAL_OINTMENT | Freq: Two times a day (BID) | CUTANEOUS | Status: DC
  Administered 2016-08-14 – 2016-08-21 (×6): via TOPICAL
  Filled 2016-08-12 (×2): qty 15

## 2016-08-12 MED ORDER — ASPIRIN EC 81 MG PO TBEC
81.0000 mg | DELAYED_RELEASE_TABLET | Freq: Every day | ORAL | Status: DC
  Administered 2016-08-12 – 2016-08-13 (×2): 81 mg via ORAL
  Filled 2016-08-12 (×3): qty 1

## 2016-08-12 MED ORDER — POTASSIUM CHLORIDE CRYS ER 20 MEQ PO TBCR
40.0000 meq | EXTENDED_RELEASE_TABLET | Freq: Three times a day (TID) | ORAL | Status: AC
  Administered 2016-08-12 – 2016-08-13 (×3): 40 meq via ORAL
  Filled 2016-08-12 (×3): qty 2

## 2016-08-12 MED ORDER — TERAZOSIN HCL 5 MG PO CAPS
10.0000 mg | ORAL_CAPSULE | Freq: Every day | ORAL | Status: DC
  Administered 2016-08-12 – 2016-08-14 (×3): 10 mg via ORAL
  Filled 2016-08-12 (×3): qty 2

## 2016-08-12 MED ORDER — ENALAPRIL MALEATE 20 MG PO TABS
20.0000 mg | ORAL_TABLET | Freq: Two times a day (BID) | ORAL | Status: DC
  Administered 2016-08-12 – 2016-08-21 (×18): 20 mg via ORAL
  Filled 2016-08-12 (×19): qty 1

## 2016-08-12 MED ORDER — FUROSEMIDE 10 MG/ML IJ SOLN
20.0000 mg | Freq: Once | INTRAMUSCULAR | Status: AC
  Administered 2016-08-12: 20 mg via INTRAVENOUS
  Filled 2016-08-12: qty 2

## 2016-08-12 MED ORDER — PANTOPRAZOLE SODIUM 20 MG PO TBEC
20.0000 mg | DELAYED_RELEASE_TABLET | Freq: Every day | ORAL | Status: DC
  Administered 2016-08-12 – 2016-08-21 (×10): 20 mg via ORAL
  Filled 2016-08-12 (×10): qty 1

## 2016-08-12 MED ORDER — PAROXETINE HCL 20 MG PO TABS
20.0000 mg | ORAL_TABLET | Freq: Every day | ORAL | Status: DC
  Administered 2016-08-12 – 2016-08-21 (×10): 20 mg via ORAL
  Filled 2016-08-12 (×10): qty 1

## 2016-08-12 MED ORDER — ATENOLOL 100 MG PO TABS
100.0000 mg | ORAL_TABLET | Freq: Two times a day (BID) | ORAL | Status: DC
  Filled 2016-08-12: qty 1

## 2016-08-12 MED ORDER — BENZONATATE 100 MG PO CAPS
100.0000 mg | ORAL_CAPSULE | Freq: Three times a day (TID) | ORAL | Status: DC | PRN
  Administered 2016-08-13: 100 mg via ORAL
  Filled 2016-08-12: qty 1

## 2016-08-12 MED ORDER — AMLODIPINE BESYLATE 10 MG PO TABS
10.0000 mg | ORAL_TABLET | Freq: Every day | ORAL | Status: DC
  Administered 2016-08-12 – 2016-08-21 (×10): 10 mg via ORAL
  Filled 2016-08-12 (×10): qty 1

## 2016-08-12 MED ORDER — GUAIFENESIN-DM 100-10 MG/5ML PO SYRP
5.0000 mL | ORAL_SOLUTION | ORAL | Status: DC | PRN
  Administered 2016-08-12 – 2016-08-13 (×3): 5 mL via ORAL
  Filled 2016-08-12 (×4): qty 5

## 2016-08-12 MED ORDER — HYDROCHLOROTHIAZIDE 25 MG PO TABS
25.0000 mg | ORAL_TABLET | Freq: Every day | ORAL | Status: DC
  Administered 2016-08-12: 25 mg via ORAL
  Filled 2016-08-12: qty 1

## 2016-08-12 MED ORDER — PRAVASTATIN SODIUM 40 MG PO TABS
40.0000 mg | ORAL_TABLET | Freq: Every day | ORAL | Status: DC
Start: 2016-08-12 — End: 2016-08-14
  Administered 2016-08-12 – 2016-08-13 (×2): 40 mg via ORAL
  Filled 2016-08-12 (×2): qty 1

## 2016-08-12 MED ORDER — HEPARIN SODIUM (PORCINE) 5000 UNIT/ML IJ SOLN
5000.0000 [IU] | Freq: Three times a day (TID) | INTRAMUSCULAR | Status: DC
  Administered 2016-08-12 – 2016-08-13 (×3): 5000 [IU] via SUBCUTANEOUS
  Filled 2016-08-12 (×3): qty 1

## 2016-08-12 MED ORDER — METHYLDOPA 500 MG PO TABS
500.0000 mg | ORAL_TABLET | Freq: Three times a day (TID) | ORAL | Status: DC
  Filled 2016-08-12: qty 1

## 2016-08-12 NOTE — Progress Notes (Signed)
[facility] Pt brought to the floor in stable condition. Vitals taken. Initial Assessment done. All immediate pertinent needs to patient addressed. Patient Guide given to patient. Important safety instructions relating to hospitalization reviewed with patient. Patient verbalized understanding. Will continue to monitor pt.  Jilda PandaBethany Karin Pinedo RN

## 2016-08-12 NOTE — Progress Notes (Signed)
   Subjective: CC: Dyspnea on exertion ZOX:[id number]:Shane Mathews is a 75 y.o. male presenting to clinic today for same day appointment. PCP: Sanjuana LettersHENSEL,WILLIAM ARTHUR, MD Concerns today include:  1. Dyspnea on exertion Patient notes that about 3 days ago he started having SOB w/ exertion.  He reports he has SOB even with standing up and getting dressed.  He notes that he has had "slight SOB" for months.  He reports that his feet have started swelling.  He notes that he has been sitting up for about 3 days.  He reports orthopnea for "a long time".  He reports he has been sleeping a in a lounge chair lately but states it is not because he is orthopneic.  Denies CP, Nausea, vomiting, diaphoresis.  He reports that he tries to stay away from salt.  He does eat a lot of processed and canned foods.  He reports compliance with anti-hypertensive medications, missing only a day or so each week.  BP runs 140-150's/60-70s.  Former smoker, quit 20+ years ago.  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  No dx OSA, does snore at night.  Denies apnea, night cough.  Never had a sleep study.  Additionally, patient does has someone that resides with him that is in fair health, his wife.    Social History Reviewed: former smoker. FamHx and MedHx reviewed.  Please see EMR.  ROS: Per HPI  Objective: Office vital signs reviewed. BP (!) 158/72   Pulse (!) 44   Temp 98.5 F (36.9 C) (Oral)   Ht 6' (1.829 m)   Wt (!) 309 lb 12.8 oz (140.5 kg)   SpO2 95%   BMI 42.02 kg/m   Physical Examination:  General: Awake, alert, obese, No acute distress Neck: JVD not appreciated Cardio: Bradycardic, regular rhythm, S1S2 heard, no murmurs appreciated Pulm: faint crackles at bases L>R, normal WOB on room air GI: soft, non-tender, obese Extremities: warm, +3 LE edema to knees, no cyanosis or clubbing MSK: standing extremely difficult for patient.  Requiring substantial assistance  Skin: venous stasis changes appreciated b/l  LE  Assessment/ Plan: 75 y.o. male   1. Dyspnea on exertion.  Concern for possible new onset heart failure.  Patient has chronic venous stasis changes in his LE, which make it difficult to tell if he is retaining more fluid than normal.  He has gained about 12 lb since weight check in 08/2015.  No echo on file.  Possible contributors undiagnosed OSA/OHS and uncontrolled HTN. - [facility] - Orders placed for echo, EKG, CXR, BNP, CMP, CBC - Discussed with senior resident on service - Patient hemodynamically stable for transfer to hospital floor 3E.  This patient was discussed with Dr McDiarmid, who agrees with my assessment and plan.    Raliegh IpAshly M Marsia Cino, DO PGY-3, [facility]

## 2016-08-12 NOTE — H&P (Signed)
Family Medicine Teaching [facility] Admission History and Physical Service Pager: [phone]  Patient name: Shane Mathews [facility] Medical record number: [id number] Date of birth: March 14, 1941 Age: 76 y.o. Gender: male  Primary Care Provider: Sanjuana Letters, MD Consultants: None Code Status: Full Code  Chief Complaint:  Acute Dyspnea on Exertion  Assessment and Plan: Shane Mathews is a 75 y.o. male presenting with exertional SOB . PMH is significant for HTN, chronic venous insufficiency, Anxiety, GERD, Claudication, Hyperlipidemia  Dyspnea on Exertion:  Onset several months ago, but acutely worsening over the past few days.  Reports + orthopnea and sleeps in recliner chair and has 12 pound weight gain since last year. Has had no prior workup for heart failure.  Patient is a former smoker, quit 20 years ago and does endorse a chronic cough for the past 6 months productive with clear sputum, but has no fevers, sick contacts or other upper respiratory symptoms.  Patient does have bradycardia with heart rate in the mid 40s which is about usual for him. First troponin negative.  Denies chest pain, dizziness or palpitations. Presentation is concerning for new onset heart failure given history of orthopnea and BNP of 498.9 and finding of vascular congestion on chest x-ray, pitting edema in lower extremities and bibasilar crackles.  Given 20 mg IV Lasix on arrival with urine output of 1.1 L since presentation. - Admit to FMTS under attending Fletke - Strict I's and O's - Daily weights - Follow-up echo - Follow-up EKG - Continue 20 mg IV Lasix twice a day - Note currently prescribed Atenolol. Consider transition to Coreg if HF confirmed.  Cough: Productive cough clear sputum for past 6 mo months.  Former smoker quit over 20 years ago.  No sick contacts and denies any chest pain, fevers or upper respiratory symptoms.  Does have history of GERD and takes Pepcid.  Also has a history of childhood  asthma with unknown PFT results.  Does not use any inhalers. Chest x-ray showed bilateral pleural effusions and vascular congestion and lungs were actually hypoinflated.  - Continue to monitor - Tessalon Perles 100mg  TID PRN - Consider albuterol PRN and PFTs  Anemia: Hemoglobin of 11.2 noted on admission. MCV 73. Due for colonoscopy. FOBT negative in 08/2015.  - Monitor on CBC - Consider Iron studies if no improvement  Hypertension: History of HTN on  amlodipine, atenolol, enalapril, HZTC, methyldopa, terazosin.  Question of compliance. Did not take BP meds this morning and pressure was 158/72 in the office and 165/53 upon presentation to ED.  Recent history of gout reported by patient and therefore discontinued HZTC.  - Continue home medication. Discontinued home HZTC.  - If BP refractory to home regimen consider workup for 2nd HTN  Hypokalemia: 2.8 upon presentation.  Does endorse ocasional cramping in his lower extremities at rest.   - Repleted with 40kdur x3 - f/u BMP - consider checking mg2+   Panic Attacks: Stable. Last documentation and office visit was in 2014. Currently only on Paxil 20 mg daily.  - Continue Paxil 20 mg daily  Hypertriglyceridemia: pravastatin 40mg  daily.  Lipid panel showed LDL 36, otherwise normal. - Continue pravastatin 40 mg  GERD: Reportedly takes pepcid - Protonix 20mg  daily  Intermittent Claudication:  Reported claudication in bilateral lower extremities. Office notes revealed a plan to workup with ABIs, the patient never followed up. - Follow-up ABI - Consider TED hose  FEN/GI: Heart healthy diet/ protonix Prophylaxis: subQ heparin  Disposition: admit to inpatient under attending  Dr. Randolm IdolFletke  History of Present Illness:  Shane Mathews is a 75 y.o. male presenting with exertional SOB  Recent history of gout in his L wrist, knee and foot one week ago, which made him relatively immobile during this period.  Reports some shortness of breath with  flights of stairs and lying flat in recent years. These have worsened over past several days to the point where he is unable to walk to the bathroom without stopping to gasp for air.  Denies any CP, palpitations or dizziness.  Notices increased swelling in his legs and endorsed a 12 pound weight gain over the past year.  Also endorses productive cough for past month that has worsened in the last week.   Denies any fevers, significant sick contacts.  Patient was seen in clinic today for a same day appointment.  He reports compliance with his anti-hypertensive medications, missing only a day or so each week.  He does not have a formal diagnosis of OSA/OHS but does snore.  Did not take any of his medications today.   Review Of Systems: Per HPI with the following additions:  ROS  Patient Active Problem List   Diagnosis Date Noted  . Dyspnea on exertion 08/12/2016  . Atherosclerosis of native arteries of extremity with intermittent claudication (HCC) 08/09/2015  . Anemia, iron deficiency 03/17/2014  . Chronic venous insufficiency 03/15/2014  . Pure hypercholesterolemia 12/01/2012  . HIP PAIN 02/27/2010  . GOUT, UNSPECIFIED 04/20/2009  . PANIC ATTACK 01/08/2007  . HYPERTRIGLYCERIDEMIA 11/05/2006  . Morbid obesity (HCC) 11/05/2006  . CATARACT 11/05/2006  . HYPERTENSION, BENIGN SYSTEMIC 11/05/2006  . REFLUX ESOPHAGITIS 11/05/2006    Past Medical History: Past Medical History:  Diagnosis Date  . Cataract   . Gout   . Hypertension     Past Surgical History: Past Surgical History:  Procedure Laterality Date  . KNEE CARTILAGE SURGERY     left    Social History: Social History  Substance Use Topics  . Smoking status: Former Smoker    Quit date: 01/07/1991  . Smokeless tobacco: Never Used  . Alcohol use No   Additional social history:  Please also refer to relevant sections of EMR.  Family History: Family History  Problem Relation Age of Onset  . Osteoporosis Mother   . Cancer  Mother     Allergies and Medications: Allergies  Allergen Reactions  . Asa [Aspirin] Shortness Of Breath and Other (See Comments)    Wheezing also (childhood allergy)  . Allopurinol Other (See Comments)    Unknown reaction  . Other Other (See Comments)    Develops migraines by strong smells   No current facility-administered medications on file prior to encounter.    Current Outpatient Prescriptions on File Prior to Encounter  Medication Sig Dispense Refill  . amLODipine (NORVASC) 10 MG tablet TAKE ONE TABLET EVERY DAY (Patient taking differently: Take 10 mg by mouth once a day) 90 tablet 3  . aspirin 81 MG EC tablet Take 81 mg by mouth daily.      Marland Kitchen. atenolol (TENORMIN) 100 MG tablet TAKE ONE TABLET TWICE DAILY (Patient taking differently: Take 100 mg by mouth two times a day) 180 tablet 3  . COLCRYS 0.6 MG tablet TAKE ONE TABLET EACH DAY TO PREVENT GOUTATTACK (Patient taking differently: Take 0.6 mg by mouth once a day to prevent gout attacks) 90 tablet 3  . enalapril (VASOTEC) 20 MG tablet TAKE ONE TABLET TWICE DAILY (Patient taking differently: Take 20 mg by  mouth two times a day) 180 tablet 3  . hydrochlorothiazide (HYDRODIURIL) 25 MG tablet TAKE ONE TABLET EACH DAY (Patient taking differently: Take 25 mg by mouth once a day) 90 tablet 3  . methyldopa (ALDOMET) 500 MG tablet TAKE 1 TABLET (500 MG TOTAL) BY MOUTH 3 (THREE) TIMES DAILY. (Patient taking differently: Take 500 mg by mouth three times a day) 270 tablet 3  . PARoxetine (PAXIL) 20 MG tablet TAKE ONE TABLET EACH DAY (Patient taking differently: Take 20 mg by mouth once a day) 90 tablet 3  . pravastatin (PRAVACHOL) 40 MG tablet TAKE ONE TABLET EVERY DAY (Patient taking differently: Take 40 mg by mouth once a day) 90 tablet 3  . terazosin (HYTRIN) 10 MG capsule TAKE ONE CAPSULE AT BEDTIME (Patient taking differently: Take 10 mg by mouth at bedtime) 90 capsule 3  . triamcinolone ointment (KENALOG) 0.1 % APPLY TO THE AFFECTED AREA  TWICE DAILY 454 g 1    Objective: BP (!) 161/51 (BP Location: Left Arm)   Pulse (!) 52   Temp 98.1 F (36.7 C) (Oral)   Resp 18   Ht 6' (1.829 m)   Wt (!) 307 lb 12.8 oz (139.6 kg)   SpO2 95%   BMI 41.75 kg/m  Exam: General: 75 year old male sitting up in bed appearing comfortable Eyes: Noninjected no discharge, EOMI ENTM: Moist mucous membranes, oropharynx clear Neck: Supple, no lymphadenopathy Cardiovascular: Bradycardic, regular rhythm no murmurs rubs or gallops heard Respiratory: Normal work of breathing, able to speak in full sentences, faint bibasilar crackles L>R.  Gastrointestinal: Soft nontender nondistended obese abdomen MSK: No deformities, moves all extremities, no cyanosis or clubbing Extremities: 3+ pitting edema in lower extremities up to the knees bilaterally Derm: Venous stasis changes seen on bilateral lower extremities  Neuro: CN II-10 WNL, no FND.  Psych: Normal mood and affect  Labs and Imaging: CBC BMET   Recent Labs Lab 08/12/16 1501  WBC 8.8  8.8  HGB 11.0*  11.2*  HCT 33.7*  34.3*  PLT 217  219    Recent Labs Lab 08/12/16 1501  NA 134*  K 2.8*  CL 96*  CO2 33*  BUN 10  CREATININE 0.94  0.91  GLUCOSE 148*  CALCIUM 8.3*    - BNP 498.9 - Cholesterol 99, Triglycerides 137, HDL 36, LDL 36  Dg Chest 2 View  Result Date: 08/12/2016 CLINICAL DATA:  Initial evaluation for acute dyspnea, shortness of breath. EXAM: CHEST  2 VIEW COMPARISON:  None available. FINDINGS: Moderate cardiomegaly. Mediastinal silhouette within normal limits. Aortic atherosclerosis noted. Lungs somewhat hypoinflated. Mild vascular congestion without overt pulmonary edema. Small bilateral pleural effusions. Associated bibasilar opacities favored to reflect effusion and/or atelectasis. Superimposed infiltrates not entirely excluded, particularly at the right lung base. No other focal airspace disease. No pneumothorax. No acute osseous abnormality. IMPRESSION: 1. Small  layering bilateral pleural effusions with associated bibasilar opacities. Atelectasis is favored, although infiltrates could be considered in the correct clinical setting. 2. Moderate cardiomegaly with mild diffuse pulmonary vascular congestion without overt pulmonary edema. 3. Aortic atherosclerosis. Electronically Signed   By: Rise MuBenjamin  McClintock M.D.   On: 08/12/2016 13:14   Dr. Serafina Royalsan Warden PGY-1, [place] [facility] FPTS Intern pager: [phone], text pages welcome  Upper Level Addendum:  I have seen and evaluated this patient along with Dr. Myrtie SomanWarden and reviewed the above note, making necessary revisions in blue.   Dr. Garry Heateraleigh Westlynn Fifer, DO, PGY3 08/12/2016; 8:49 PM

## 2016-08-12 NOTE — H&P (Signed)
I have interviewed and examined the patient with Dr Nadine CountsGottschalk.  I have discussed the case and verified the key findings with Dr. Nadine CountsGottschalk.   I agree with their assessments and plans as documented in their admission note.   1. Acute Dyspnea on Exertion - Onset several months ago, progressive over last 3 days - (+) orthopnea - sleeping in recliner chair. - Increase body weight ~11 pounds since last measure a year ago - Chronic woody leg edema, bilaterally with venous dermatitis - No prior work up for Heart Failure - Hx of refractory hypertension, possible peripheral artery disease manifest as leg claudication - Patient believes he will be unable to climb steps to get back into his home secondary to dyspnea and knee pain.  - Patient lives with wife who is in fair health.  No personal care services.  - Patient med list  Beer's List medication (methyldopa Cherene Julian[bradycardia / orthostasis], terazosin [orthostasis} - Physical Exam with high SBP, bradycardia, left basilar crackles, No wheezing, No gallup, No murmur, Woody edema with venous stasis changes bilaterally) - Ddx: Acute decompensated heart failure, COPD exacerbation, P.E., pneumonia, OSA, OHS)  Recommendations - Admit patient given need for diagnostic and therapeutic interventions that will need close monitoring, patient's ambulatory and ADL limitations, and limited resource in community to support patient during this process.  Patient [facility] as hemodynamically stable, not requiring oxygen at rest, no current need for IV access.   - If patient does not have a hospital bed by noon, [facility], evalaution and treatment with [facility].  [facility] given limited resources for task.  - CXR (2V), BNP, Cardiac Enzymes, EKG, CME, telemetry, Echocardiogram  - Trial of IV  furosemide 40 mg, double dosing until effective dose found then use effective dose multiple times a day. - Leave decision as to whether to at prednisone / antibiotic / SABA for AE-COPD to FMTS - Unna boot wraps bilaterally once effective diuresis established for venous stasis dermatitis - Consider stopping or decreasing methydopa & atenolol dosages given patient's bradycardia - Physical Therapy consult for gait, balance, strength and endurance impairment. Also, assessment for need for Skilled Nursing Facility placement for rehabilitation - Occupational Therapy consult for capacity for ADLs - Case Management for arranging skilled nursing care follow up to monitor cardiac status and home safety evaluation.  - Overnight pulse ox on room air to look for evidence of desaturation of OSA - Check ambulatory pulse ox off and on oxygen both at rest and with exertion to see if patient requires home oxygen therapy.

## 2016-08-13 ENCOUNTER — Encounter (HOSPITAL_COMMUNITY): Payer: Self-pay | Admitting: *Deleted

## 2016-08-13 ENCOUNTER — Inpatient Hospital Stay (HOSPITAL_COMMUNITY): Payer: Medicare Other

## 2016-08-13 ENCOUNTER — Encounter (HOSPITAL_COMMUNITY): Payer: Medicare Other

## 2016-08-13 DIAGNOSIS — R0989 Other specified symptoms and signs involving the circulatory and respiratory systems: Secondary | ICD-10-CM | POA: Diagnosis present

## 2016-08-13 DIAGNOSIS — R739 Hyperglycemia, unspecified: Secondary | ICD-10-CM

## 2016-08-13 DIAGNOSIS — R001 Bradycardia, unspecified: Secondary | ICD-10-CM | POA: Diagnosis present

## 2016-08-13 DIAGNOSIS — J90 Pleural effusion, not elsewhere classified: Secondary | ICD-10-CM

## 2016-08-13 DIAGNOSIS — I70213 Atherosclerosis of native arteries of extremities with intermittent claudication, bilateral legs: Secondary | ICD-10-CM

## 2016-08-13 DIAGNOSIS — R0600 Dyspnea, unspecified: Secondary | ICD-10-CM

## 2016-08-13 DIAGNOSIS — E876 Hypokalemia: Secondary | ICD-10-CM | POA: Diagnosis present

## 2016-08-13 LAB — CBC
HCT: 32.8 % — ABNORMAL LOW (ref 39.0–52.0)
HEMOGLOBIN: 10.5 g/dL — AB (ref 13.0–17.0)
MCH: 23.8 pg — AB (ref 26.0–34.0)
MCHC: 32 g/dL (ref 30.0–36.0)
MCV: 74.4 fL — ABNORMAL LOW (ref 78.0–100.0)
PLATELETS: 199 10*3/uL (ref 150–400)
RBC: 4.41 MIL/uL (ref 4.22–5.81)
RDW: 15.5 % (ref 11.5–15.5)
WBC: 7.8 10*3/uL (ref 4.0–10.5)

## 2016-08-13 LAB — BASIC METABOLIC PANEL
Anion gap: 8 (ref 5–15)
BUN: 7 mg/dL (ref 6–20)
CALCIUM: 8.4 mg/dL — AB (ref 8.9–10.3)
CO2: 31 mmol/L (ref 22–32)
CREATININE: 0.96 mg/dL (ref 0.61–1.24)
Chloride: 95 mmol/L — ABNORMAL LOW (ref 101–111)
GFR calc Af Amer: >60 mL/min (ref >60)
GFR calc non Af Amer: >60 mL/min (ref >60)
GLUCOSE: 110 mg/dL — AB (ref 65–99)
Potassium: 3 mmol/L — ABNORMAL LOW (ref 3.5–5.1)
Sodium: 134 mmol/L — ABNORMAL LOW (ref 135–145)

## 2016-08-13 LAB — URIC ACID: Uric Acid, Serum: 8.4 mg/dL — ABNORMAL HIGH (ref 4.4–7.6)

## 2016-08-13 LAB — HEMOGLOBIN A1C
HEMOGLOBIN A1C: 6.4 % — AB (ref 4.8–5.6)
MEAN PLASMA GLUCOSE: 137 mg/dL

## 2016-08-13 LAB — ECHOCARDIOGRAM COMPLETE
HEIGHTINCHES: 72 in
Weight: 4832 oz

## 2016-08-13 LAB — MAGNESIUM: MAGNESIUM: 2.1 mg/dL (ref 1.7–2.4)

## 2016-08-13 MED ORDER — LORAZEPAM 0.5 MG PO TABS
0.5000 mg | ORAL_TABLET | ORAL | Status: DC | PRN
  Administered 2016-08-15: 0.5 mg via ORAL
  Filled 2016-08-13: qty 1

## 2016-08-13 MED ORDER — HYDRALAZINE HCL 50 MG PO TABS
50.0000 mg | ORAL_TABLET | Freq: Three times a day (TID) | ORAL | Status: DC
  Administered 2016-08-13 – 2016-08-14 (×3): 50 mg via ORAL
  Filled 2016-08-13 (×3): qty 1

## 2016-08-13 MED ORDER — FUROSEMIDE 20 MG PO TABS
20.0000 mg | ORAL_TABLET | Freq: Two times a day (BID) | ORAL | Status: DC
  Administered 2016-08-13: 20 mg via ORAL
  Filled 2016-08-13: qty 1

## 2016-08-13 MED ORDER — LORAZEPAM 0.5 MG PO TABS
0.5000 mg | ORAL_TABLET | Freq: Two times a day (BID) | ORAL | Status: DC
  Administered 2016-08-13 – 2016-08-21 (×13): 0.5 mg via ORAL
  Filled 2016-08-13 (×13): qty 1

## 2016-08-13 MED ORDER — SPIRONOLACTONE 25 MG PO TABS
25.0000 mg | ORAL_TABLET | Freq: Every day | ORAL | Status: DC
  Administered 2016-08-13: 25 mg via ORAL
  Filled 2016-08-13 (×2): qty 1

## 2016-08-13 MED ORDER — APIXABAN 5 MG PO TABS
5.0000 mg | ORAL_TABLET | Freq: Two times a day (BID) | ORAL | Status: DC
  Administered 2016-08-13 – 2016-08-21 (×16): 5 mg via ORAL
  Filled 2016-08-13 (×17): qty 1

## 2016-08-13 MED ORDER — FUROSEMIDE 10 MG/ML IJ SOLN
40.0000 mg | Freq: Two times a day (BID) | INTRAMUSCULAR | Status: DC
  Administered 2016-08-13 – 2016-08-14 (×2): 40 mg via INTRAVENOUS
  Filled 2016-08-13 (×2): qty 4

## 2016-08-13 MED ORDER — FUROSEMIDE 10 MG/ML IJ SOLN
20.0000 mg | Freq: Two times a day (BID) | INTRAMUSCULAR | Status: DC
  Filled 2016-08-13: qty 2

## 2016-08-13 MED ORDER — TRAMADOL HCL 50 MG PO TABS
50.0000 mg | ORAL_TABLET | Freq: Four times a day (QID) | ORAL | Status: DC | PRN
  Filled 2016-08-13: qty 1

## 2016-08-13 MED ORDER — POTASSIUM CHLORIDE CRYS ER 20 MEQ PO TBCR: 40.0000 meq | EXTENDED_RELEASE_TABLET | Freq: Four times a day (QID) | ORAL | Status: DC

## 2016-08-13 MED ORDER — POTASSIUM CHLORIDE CRYS ER 20 MEQ PO TBCR
40.0000 meq | EXTENDED_RELEASE_TABLET | Freq: Four times a day (QID) | ORAL | Status: AC
  Administered 2016-08-13 (×2): 40 meq via ORAL
  Filled 2016-08-13 (×2): qty 2

## 2016-08-13 NOTE — Progress Notes (Signed)
PT Cancellation Note  Patient Details Name: Shane Mathews MRN: [id number] DOB: 03/22/1941   Cancelled Treatment:    Reason Eval/Treat Not Completed: Patient declined, no reason specified. Pt refusing to participate in therapy session secondary to "breathing issues". Pt's SPO2 was 94% on RA in bed with HOB elevated. However, pt continuing to refuse participation in PT evaluation and stating that he would work with therapy tomorrow. PT will continue to f/u with pt as appropriate.   Alessandra BevelsJennifer M Betzabeth Derringer 08/13/2016, 1:34 PM Deborah ChalkJennifer Raffaela Ladley, PT, DPT [phone]

## 2016-08-13 NOTE — Progress Notes (Signed)
VASCULAR LAB PRELIMINARY  ARTERIAL  ABI completed: Right ABI of 0.71 and left ABI of 0.69 are suggestive of moderate arterial occlusive disease at rest. Right TBI of 0.46 and left TBI of 0.47 are suggestive of abnormal arterial flow at rest.   RIGHT    LEFT    PRESSURE WAVEFORM  PRESSURE WAVEFORM  BRACHIAL 198 Triphasic BRACHIAL 210 Triphasic  DP 150 Monophasic DP 144 Monophasic  PT 148 Monophasic PT 144 Monophasic  GREAT TOE 97 NA GREAT TOE 99 NA    RIGHT LEFT  ABI 0.71 0.69     Elsie StainGregory J Paytin Ramakrishnan, RVT 08/13/2016, 3:01 PM

## 2016-08-13 NOTE — Progress Notes (Signed)
  Echocardiogram 2D Echocardiogram has been performed.  Shane Mathews 08/13/2016, 10:55 AM

## 2016-08-13 NOTE — Progress Notes (Signed)
PCP note: There comes a time of reckoning:  Likely, now is likely that time for my long time patient, Shane Mathews.  He has an outpatient appointment with me for tomorrow for "some small problems" but could not wait.  Main issues are: DOE 1. While the echo is just being done, I will be shocked if it does not demonstrate class 3  To 4 CHF.  Etiology could be hypertensive cardiomyopathy and is also at risk for ischemic cardiomyopathy.  He has bradycardia and new to me atrial fibrillation which may be contributing to his symptomatology.  Absolutely needs inpatient cards consult, likely cath, likely anticoag and likely revamping of his whole treatment approach.  2. Difficulty standing: Again, [name] has not allowed me to do much testing on him.  I suspect he has both significant osteoarthritis of knees and/or hips.  He also likely has claudication from PVD.  We should better define his problems while an inpatient.  I cannot rely on him to follow through with testing as an outpatient.  We should also get PT involved to better assess ambulatory status.  He also has a history of gout, which may be contributing.  We should check a serum uric acid.   3. Anxiety: He is already asking when he can go home.  "I start having panic attacks the longer I stare at these four walls."  I will add a low dose benzo for in hospital use only (do not DC on benzo.)  He tells me his anxiety is a big reason he has avoided doctors and follow up visits.   4. Longstanding severe hypertension.  On multidrug regimen with likely poor control.  Bradycardia will require changes to that regimen. 5. Morbid obesity.  He told me again today that he was getting serious about this - the same thing he told me at my last visit with him a year ago.   6. At risk for OSA.   See resident notes for full problem list.

## 2016-08-13 NOTE — Progress Notes (Signed)
Family Medicine Teaching Service Daily Progress Note Intern Pager: [phone]  [facility] record number: [id number] Date of birth: Dec 11, 1940 Age: 75 y.o. Gender: male  Primary Care Provider: Sanjuana LettersHENSEL,WILLIAM ARTHUR, Mathews Consultants: None Code Status: Full  Pt Overview and Major Events to Date:  1. Admit to FMTS 08/12/16  Assessment and Plan: Shane Mathews is a 75 y.o. male presenting with exertional SOB . PMH is significant for HTN, chronic venous insufficiency, Anxiety, GERD, Claudication, Hyperlipidemia  Dyspnea on Exertion:  Onset several months ago, but acutely worsening over the past few days.  Reports + orthopnea and sleeps in recliner chair and has 12 pound weight gain since last year. Has had no prior workup for heart failure.  Patient is a former smoker, quit 20 years ago and does endorse a chronic cough for the past 6 months productive with clear sputum, but has no fevers, sick contacts or other upper respiratory symptoms.  Patient does have bradycardia with heart rate in the mid 40s which is about usual for him. First troponin negative.  Denies chest pain, dizziness or palpitations. Presentation is concerning for new onset heart failure given history of orthopnea and BNP of 498.9 and finding of vascular congestion on chest x-ray, pitting edema in lower extremities and bibasilar crackles.  Given 20 mg IV Lasix on arrival and has had 2.8 L UOP to date.  Weight down 5lb since admission. Continues faint bibasilar crackles on exam. Could be due to hypertensive cardiomyopathy.  - Strict I's and O's - Daily weights - Follow-up echo and switch to coreg if HF confirmed - Follow-up EKG - Continue 20 mg IV Lasix BID  New onset afb:  Noted on EKG.  Patient endorses dyspnea with exertion.  Denies palpitations or CP.  No history of syncope or CVA/TIA.  Did not appreciate abnormal rhythm on exam this morning.  Continues to be bradycardic and currently holding BB. -  repeat EKG - cardiology consult - ?anticoagulation  Cough: Productive cough clear sputum for past 6 mo months.  Former smoker quit over 20 years ago.  No sick contacts and denies any chest pain, fevers or upper respiratory symptoms.  Does have history of GERD and takes Pepcid.  Also has a history of childhood asthma with unknown PFT results.  Does not use any inhalers. Chest x-ray showed bilateral pleural effusions and vascular congestion and lungs were actually hypoinflated.   - Continue to monitor - Tessalon Perles 100mg  TID PRN - Consider albuterol PRN and PFTs  Anemia: Hemoglobin of 11.2 noted on admission. MCV 73. Due for colonoscopy. FOBT negative in 08/2015.  - Monitor on CBC - Consider Iron studies if no improvement  Hypertension: History of HTN on  amlodipine, atenolol, enalapril, HZTC, methyldopa, terazosin.  Question of compliance. Did not take BP meds this morning and pressure was 158/72 in the office and 165/53 upon presentation to ED.  Recent history of gout reported by patient and therefore discontinued HZTC.  - Continue home medication. Discontinued home HZTC.  - If BP refractory to home regimen consider workup for 2nd HTN - Add spironolactone 25mg  daily-- will also help with K+  Hypokalemia: 2.8 upon presentation. Improved to 3.0 with 2 doses kdur 40. Only 0.2 increase with 80kdur. Appropriate to check magnesium.  - Repleted with 40kdur x3 - f/u BMP - f/u magnesium - added spironolactone 25mg  BID  Panic Attacks: Stable. Last documentation and office visit was in 2014. Currently only on Paxil 20 mg daily.   -  Continue Paxil 20 mg daily - add ativan 0.5mg  BID q4hrs PRN  Hypertriglyceridemia: pravastatin 40mg  daily.  Lipid panel showed LDL 36, otherwise normal. - Continue pravastatin 40 mg  GERD: Reportedly takes pepcid - Protonix 20mg  daily  Intermittent Claudication:  Reported claudication in bilateral lower extremities. Office notes revealed a plan to workup  with ABIs, the patient never followed up. - Follow-up ABI - Consider TED hose  FEN/GI: Heart healthy diet/ protonix Prophylaxis: subQ heparin  Disposition: discharge home when stable  Subjective:  Patient feels well this morning and denies CP or palpitations.  Continues to have SOB with exertion.  Feels improved after diuresis.   Objective: Temp:  [97.4 F (36.3 C)-98.5 F (36.9 C)] 98.2 F (36.8 C) (12/06 0358) Pulse Rate:  [44-59] 54 (12/06 0358) Resp:  [18-20] 18 (12/06 0358) BP: (158-186)/(47-72) 183/49 (12/06 0358) SpO2:  [93 %-97 %] 93 % (12/06 0358) Weight:  [302 lb (137 kg)-309 lb 12.8 oz (140.5 kg)] 302 lb (137 kg) (12/06 0358) Physical Exam: General: 75 year old male sitting up in bed appearing comfortable Eyes: Noninjected no discharge, EOMI ENTM: Moist mucous membranes, oropharynx clear Neck: Supple, no lymphadenopathy Cardiovascular: Bradycardic, regular rhythm no murmurs rubs or gallops heard Respiratory: Normal work of breathing, able to speak in full sentences, faint bibasilar crackles L>R.  Gastrointestinal: Soft nontender nondistended obese abdomen MSK: No deformities, moves all extremities, no cyanosis or clubbing Extremities: 3+ pitting edema in lower extremities up to the knees bilaterally Derm: Venous stasis changes seen on bilateral lower extremities  Neuro: CN II-10 WNL, no FND.  Psych: Normal mood and affect  Laboratory:  Recent Labs Lab 08/12/16 1501 08/13/16 0339  WBC 8.8  8.8 7.8  HGB 11.0*  11.2* 10.5*  HCT 33.7*  34.3* 32.8*  PLT 217  219 199    Recent Labs Lab 08/12/16 1501 08/13/16 0339  NA 134* 134*  K 2.8* 3.0*  CL 96* 95*  CO2 33* 31  BUN 10 7  CREATININE 0.94  0.91 0.96  CALCIUM 8.3* 8.4*  PROT 5.8*  --   BILITOT 0.7  --   ALKPHOS 77  --   ALT 14*  --   AST 16  --   GLUCOSE 148* 110*    Imaging/Diagnostic Tests: Dg Chest 2 View  Result Date: 08/12/2016 CLINICAL DATA:  Initial evaluation for acute dyspnea,  shortness of breath. EXAM: CHEST  2 VIEW COMPARISON:  None available. FINDINGS: Moderate cardiomegaly. Mediastinal silhouette within normal limits. Aortic atherosclerosis noted. Lungs somewhat hypoinflated. Mild vascular congestion without overt pulmonary edema. Small bilateral pleural effusions. Associated bibasilar opacities favored to reflect effusion and/or atelectasis. Superimposed infiltrates not entirely excluded, particularly at the right lung base. No other focal airspace disease. No pneumothorax. No acute osseous abnormality. IMPRESSION: 1. Small layering bilateral pleural effusions with associated bibasilar opacities. Atelectasis is favored, although infiltrates could be considered in the correct clinical setting. 2. Moderate cardiomegaly with mild diffuse pulmonary vascular congestion without overt pulmonary edema. 3. Aortic atherosclerosis. Electronically Signed   By: Rise MuBenjamin  McClintock M.D.   On: 08/12/2016 13:14    Renne Muscaaniel L Ceaira Ernster, Mathews 08/13/2016, 7:18 AM PGY-1, [place] [facility] FPTS Intern pager: [phone], text pages welcome

## 2016-08-13 NOTE — Progress Notes (Signed)
OT Cancellation Note  Patient Details Name: Fayrene Fearingnthony H Koerber MRN: [id number] DOB: 1941-01-14   Cancelled Treatment:    Reason Eval/Treat Not Completed: Other (comment) (Pt declined. )Will attempt tomorrow.  [name]  Terelle Dobler, OTR/L  [phone] 08/13/2016 08/13/2016, 2:44 PM

## 2016-08-13 NOTE — Discharge Summary (Signed)
[facility]  [facility] record number: [id number] Date of birth: November 29, 1940 Age: 75 y.o. Gender: male Date of Admission: 08/12/2016  Date of Discharge: 46 Admitting Physician: Leighton Roachodd D McDiarmid, MD  Primary Care Provider: Sanjuana LettersHENSEL,WILLIAM ARTHUR, MD Consultants: Cardiology  Indication for Hospitalization: Shortness of breath  Discharge Diagnoses/Problem List:  Principal Problem:   Pleural effusion Active Problems:   Dyspnea   Persistent atrial fibrillation (HCC)   Edema   Hyperglycemia   Hypokalemia   Absent pulse in lower extremity   Bradycardia   Hypertensive heart disease   Acute on chronic diastolic heart failure (HCC)   Resistant hypertension   PAD (peripheral artery disease) (HCC)   Abnormal thyroid function test   Aspiration pneumonia (HCC)   Adrenal insufficiency (HCC)   Polyarticular gout   Acute gouty arthritis    Disposition: Discharged to SNF  Discharge Condition: Stable, improved  Discharge Exam:    Brief Hospital Course:  Patient is a 75 year old male with past medical history of hypertension refractory to 6 medications, venous insufficiency, intermittent claudication who presented to clinic with acute shortness of breath concerning for CHF exacerbation and [facility]. Patient's heart rate was in the mid 40s and his beta blocker was held for this reason as well as methyldopa and given his recent history of gout and his hydrochlorothiazide was also held.  Started on 20 mg IV Lasix and had 2.8 L urine output and was found to have BNP of 498 and chest x-ray showing vascular congestion and also presented with 3+ pitting edema to the knees bilaterally and faint bibasilar crackles.  Also noted to have new onset A. fib on EKG, however patient continued to be asymptomatic aside from his exertional shortness of breath.  The next morning his blood pressure was  poorly controlled and was also mildly hypokalemic and we started 25 mg spironolactone.  Cardiology saw the patient who increased his Lasix to 40 mg twice a day IV Lasix and he had a similar urine output the next day and also hydralazine 50 mg 3 times a day was added to his regimen. Patient was also also started on abixaban 5 mg twice a day and aspirin was discontinued.  His echo showed normal systolic function and ejection fraction of 60-65%. His diastolic function read was limited due to active atrial fibrillation.  Blood pressure remained poorly controlled after addition of spironolactone and hydralazine 50mg  TID.  Spironolactone was further increased to 50 mg and hydralazine to 100 mg and Coreg 6.25 twice a day was also added.   Patient continued to diurese well with 60mg  IV lasix at the time of discharge was transitioned over to 40 mg PO daily. During admission patient also started to note acute left knee pain and medial ankle pain that he felt to be a gout flare.  He was started on prednisone 30 mg daily and colchicine 0.6 mg.   Started workup for 2nd HTN with 24hr cortisol and metanephrines and attempted a renal artery US.  This read was limited due to body habitus and gas.   At the time of discharge patient was on norvasc 10mg  daily, enalapril 20mg  BID and hydralazine 100mg  TID, terazosin 10mg  daily, spironolactone to 50mg , coreg 6.25mg  BID and blood pressure was still fairly resistant. VMA WNL and cortisol pending.  Initial plan was for discharge to SNF for general deconditioning and to address his limitations in order to improve his  overall safety and independence with functional mobility.  Insurance denied SNF and patient was discharged home.  See below for issues that need to be addressed for follow up.    Issues for Follow Up:  1. Atrial fibrillation- new onset. To follow up with [facility] in 3 weeks for cardioversion.  Will continue with abixaban 5mg  BID.  2. Hypertension- Discharged on norvasc 10mg   daily, enalapril 20mg  BID and hydralazine 100mg  TID, terazosin 10mg  daily, spironolactone to 50mg , coreg 6.25mg  BID.  Consider CTA for RAS workup.  F/u 24hr costisol and 24hr metanephrines. 3. Bradycardia- Asymptomatic, continue to monitor and DC on coreg 6.25mg  BID 4. Pre-diabetes- A1C of 6.4.   5. Probable OSA- Sleeps in armchair and reportedly snores.  Important to have sleep study 6. Gout: Discharged on prednisone 30mg  with instructions to continue daily and for two days after symptoms resolve.  Repeat uric acid levels. Discontinue colchicine if levels are WNL and continue febuxostat.  7. Intermittent Claudication:  Reported claudication in bilateral lower extremities in the past. ABIs show moderate arterial disease (ABI: R: 0.71, L: 0.69). Patient denies current symptoms. Switched from prevastatin to lipitor 40mg .  Significant Procedures: echo, renal artery [place]  Significant Labs and Imaging:   Recent Labs Lab 08/17/16 0857 08/18/16 0523 08/21/16 0456  WBC 9.6 9.5 8.3  HGB 10.9* 11.0* 11.0*  HCT 34.1* 34.2* 35.7*  PLT 219 212 265    Recent Labs Lab 08/17/16 0158 08/18/16 0523 08/19/16 0407 08/20/16 0404 08/21/16 0456  NA 132* 133* 132* 137 140  K 3.5 3.7 3.9 3.7 3.7  CL 92* 94* 93* 97* 98*  CO2 29 29 29 31  33*  GLUCOSE 115* 130* 169* 140* 118*  BUN 18 17 22* 30* 30*  CREATININE 1.10 1.03 1.13 1.13 1.12  CALCIUM 8.2* 8.5* 8.6* 8.8* 8.7*   No results found. Results/Tests Pending at Time of Discharge: 24 hr cortisol, 24hr metanepherines  Discharge Medications:  Allergies as of 08/21/2016      Reactions   Asa [aspirin] Shortness Of Breath, Other (See Comments)   Wheezing also (childhood allergy)   Allopurinol Other (See Comments)   Unknown reaction   Other Other (See Comments)   Develops migraines by strong smells      Medication List    STOP taking these medications   acetaminophen 500 MG tablet Commonly known as:  TYLENOL   aspirin 81 MG EC tablet    atenolol 100 MG tablet Commonly known as:  TENORMIN   hydrochlorothiazide 25 MG tablet Commonly known as:  HYDRODIURIL   methyldopa 500 MG tablet Commonly known as:  ALDOMET   pravastatin 40 MG tablet Commonly known as:  PRAVACHOL     TAKE these medications   amLODipine 10 MG tablet Commonly known as:  NORVASC TAKE ONE TABLET EVERY DAY What changed:  See the new instructions.   apixaban 5 MG Tabs tablet Commonly known as:  ELIQUIS Take 1 tablet (5 mg total) by mouth 2 (two) times daily.   atorvastatin 40 MG tablet Commonly known as:  LIPITOR Take 1 tablet (40 mg total) by mouth daily at 6 PM.   benzonatate 100 MG capsule Commonly known as:  TESSALON Take 1 capsule (100 mg total) by mouth 3 (three) times daily as needed for cough.   carvedilol 6.25 MG tablet Commonly known as:  COREG Take 1 tablet (6.25 mg total) by mouth 2 (two) times daily with a meal.   colchicine 0.6 MG tablet Take 1 tablet (0.6 mg total)  by mouth daily. What changed:  See the new instructions.   enalapril 20 MG tablet Commonly known as:  VASOTEC TAKE ONE TABLET TWICE DAILY What changed:  See the new instructions.   febuxostat 40 MG tablet Commonly known as:  ULORIC Take 1 tablet (40 mg total) by mouth daily.   furosemide 40 MG tablet Commonly known as:  LASIX Take 1 tablet (40 mg total) by mouth daily.   guaiFENesin-dextromethorphan 100-10 MG/5ML syrup Commonly known as:  ROBITUSSIN DM Take 5 mLs by mouth every 4 (four) hours as needed for cough.   hydrALAZINE 100 MG tablet Commonly known as:  APRESOLINE Take 1 tablet (100 mg total) by mouth every 8 (eight) hours.   LORazepam 0.5 MG tablet Commonly known as:  ATIVAN Take 1 tablet (0.5 mg total) by mouth every 4 (four) hours as needed for anxiety (panic attack).   PARoxetine 20 MG tablet Commonly known as:  PAXIL Take 1 tablet (20 mg total) by mouth daily. What changed:  See the new instructions.   predniSONE 10 MG  tablet Commonly known as:  DELTASONE Take 3 tablets (30 mg total) by mouth daily with breakfast.   spironolactone 50 MG tablet Commonly known as:  ALDACTONE Take 1 tablet (50 mg total) by mouth daily.   terazosin 10 MG capsule Commonly known as:  HYTRIN Take 1 capsule (10 mg total) by mouth at bedtime. What changed:  See the new instructions.   traMADol 50 MG tablet Commonly known as:  ULTRAM Take 1 tablet (50 mg total) by mouth every 6 (six) hours as needed for moderate pain.   triamcinolone ointment 0.1 % Commonly known as:  KENALOG Apply topically 2 (two) times daily. What changed:  See the new instructions.       Discharge Instructions: Please refer to Patient Instructions section of EMR for full details.  Patient was counseled important signs and symptoms that should prompt return to medical care, changes in medications, dietary instructions, activity restrictions, and follow up appointments.   Follow-Up Appointments: Follow-up Information    Barrett, Bjorn LoserRhonda, PA-C. Go on 09/04/2016.   Specialties:  Cardiology, Radiology Why:  @8am  for post hospital cardiology follow up Contact information: [address] [place] [address] [phone]        Beaulah Dinninghristina M Gambino, MD. Go on 08/28/2016.   Specialty:  Family Medicine Why:  8:30am for hospital follow up Contact information: [address] [place] [address] [phone]        [facility] Follow up.   Why:  Home Health Services RN/ Physical Therapy, Occupational Therapy, Home Health Aide Contact information: [address] [place] [address] [phone]           Renne Muscaaniel L Warden, MD 08/22/2016, 9:34 PM PGY-1, [facility]

## 2016-08-13 NOTE — Consult Note (Signed)
CARDIOLOGY CONSULT NOTE   Patient ID: Shane Mathews MRN: [id number] DOB/AGE: October 05, 1940 75 y.o.  Admit date: 08/12/2016  Primary Physician   Sanjuana LettersHENSEL,WILLIAM ARTHUR, MD Primary Cardiologist   New Reason for Consultation   CHF and atrial fib Requesting MD: Dr McDiarmid  YQM:VHQIONGHPI:Shane Mathews is a 75 y.o. year old male with a history of HTN, GERD, HLD, venous insufficiency, anxiety, claudication and ?OSA. RBBB on old ECG.  Pt admitted 12/04 w/ SOB, CHF, and was in atrial fib w/ slow VR, cardiology asked to see. Also w/ hypokalemia and hyperglycemia.   He gets "panic attacks" where he has to get in the car and drives around with the window down. He feels this is not related to SOB.   He has been sleeping in a recliner for 6 months. He feels this is because he had trouble lying on his R side due to pain. He could not sleep on his L side. Feels he also had problems due to gout and abdominal size. He denies orthopnea or PND.  He never gets chest pain. He has never had palpitations or been aware of an irregular.  His gout attacks have been coming more and more often and have been more severe. Because of this, his ambulation has been more and more limited. At Thanksgiving, he was chair-bound, his wife taking care of personal needs.   When the gout got better and he was trying to get up a little more, he noticed the SOB, was dyspneic with minimal exertion. He has had LE edema during the day for a long time, states he does not wake with it.   Has seen Dermatology for venous stasis, and was given a cream to use.    Dr Leveda AnnaHensel noted absent LE pulses, pt referred for arterial Dopplers 08/2015, never performed.    Past Medical History:  Diagnosis Date  . Cataract   . Gout   . Hypertension      Past Surgical History:  Procedure Laterality Date  . KNEE CARTILAGE SURGERY     left    Allergies  Allergen Reactions  . Asa [Aspirin] Shortness Of Breath and Other (See Comments)   Wheezing also (childhood allergy)  . Allopurinol Other (See Comments)    Unknown reaction  . Other Other (See Comments)    Develops migraines by strong smells    I have reviewed the patient's current medications . amLODipine  10 mg Oral Daily  . aspirin EC  81 mg Oral Daily  . enalapril  20 mg Oral BID  . furosemide  20 mg Intravenous BID  . heparin  5,000 Units Subcutaneous Q8H  . LORazepam  0.5 mg Oral BID  . pantoprazole  20 mg Oral Daily  . PARoxetine  20 mg Oral Daily  . pravastatin  40 mg Oral q1800  . spironolactone  25 mg Oral Daily  . terazosin  10 mg Oral QHS  . triamcinolone ointment   Topical BID    benzonatate, guaiFENesin-dextromethorphan, LORazepam, traMADol  Medication Sig  acetaminophen (TYLENOL) 500 MG tablet Take 1,000 mg by mouth every 6 (six) hours as needed (for gout pain).  amLODipine (NORVASC) 10 MG tablet TAKE ONE TABLET EVERY DAY Patient taking differently: Take 10 mg by mouth once a day  aspirin 81 MG EC tablet Take 81 mg by mouth daily.    atenolol (TENORMIN) 100 MG tablet TAKE ONE TABLET TWICE DAILY Patient taking differently: Take 100 mg by mouth two times  a day  COLCRYS 0.6 MG tablet TAKE ONE TABLET EACH DAY TO PREVENT GOUTATTACK Patient taking differently: Take 0.6 mg by mouth once a day to prevent gout attacks  enalapril (VASOTEC) 20 MG tablet TAKE ONE TABLET TWICE DAILY Patient taking differently: Take 20 mg by mouth two times a day  hydrochlorothiazide (HYDRODIURIL) 25 MG tablet TAKE ONE TABLET EACH DAY Patient taking differently: Take 25 mg by mouth once a day  methyldopa (ALDOMET) 500 MG tablet TAKE 1 TABLET (500 MG TOTAL) BY MOUTH 3 (THREE) TIMES DAILY. Patient taking differently: Take 500 mg by mouth three times a day  PARoxetine (PAXIL) 20 MG tablet TAKE ONE TABLET EACH DAY Patient taking differently: Take 20 mg by mouth once a day  pravastatin (PRAVACHOL) 40 MG tablet TAKE ONE TABLET EVERY DAY Patient taking differently: Take 40 mg by  mouth once a day  terazosin (HYTRIN) 10 MG capsule TAKE ONE CAPSULE AT BEDTIME Patient taking differently: Take 10 mg by mouth at bedtime  triamcinolone ointment (KENALOG) 0.1 % APPLY TO THE AFFECTED AREA TWICE DAILY     Social History   Social History  . Marital status: Married    Spouse name: Orene Desanctisonnye  . Number of children: 4  . Years of education: N/A   Occupational History  . retired-construction Scientist, water qualitymaterials sales    Social History Main Topics  . Smoking status: Former Smoker    Quit date: 01/07/1991  . Smokeless tobacco: Never Used  . Alcohol use No  . Drug use: No  . Sexual activity: Not on file   Other Topics Concern  . Not on file   Social History Narrative   Health Care POA:    Emergency Contact: Wife, Pearletha Alfredonnye Hadsall, (c) [phone]   End of Life Plan:    Who lives with you: Lives with wife in 1 story home   Any pets: none   Diet: Patient has a varied diet; however does not control weight gain or hypertension   Exercise: Patient does not have current exercise plan.   Seatbelts: Patient reports wearing seatbelt when in vehicle.   Wynelle LinkSun Exposure/Protection: Patient reports wearing ball cap and long sleeves when outdoors.    Hobbies: reading, watching tv, traveling, visiting family          Family Status  Relation Status  . Mother Deceased at age 75  . Father Deceased at age 75   Family History  Problem Relation Age of Onset  . Osteoporosis Mother   . Cancer Mother      ROS:  Full 14 point review of systems complete and found to be negative unless listed above.  Physical Exam: Blood pressure (!) 183/49, pulse (!) 54, temperature 98.2 F (36.8 C), temperature source Oral, resp. rate 18, height 6' (1.829 m), weight (!) 302 lb (137 kg), SpO2 93 %.  General: Well developed, well nourished, male in no acute distress Head: Eyes PERRLA, No xanthomas.   Normocephalic and atraumatic, oropharynx without edema or exudate. Dentition: poor Lungs: decreased BS bases w/ a  few rales. Heart: Heart irregular rate and rhythm with S1, S2, no murmur. pulses are 2+ upper extrem. Unable to palpate in both LE Neck: No carotid bruits. No lymphadenopathy. JVD elevated 9-10 cm Abdomen: Bowel sounds present, abdomen soft and non-tender without masses or hernias noted. Msk:  No spine or cva tenderness. No weakness, no joint deformities or effusions. Extremities: No clubbing or cyanosis. 1-2+ LE edema.  Neuro: Alert and oriented X 3. No focal deficits  noted. Psych:  Good affect, responds appropriately Skin: No rashes or lesions noted. LE skin is very dry, large flakes  Labs:   Lab Results  Component Value Date   WBC 7.8 08/13/2016   HGB 10.5 (L) 08/13/2016   HCT 32.8 (L) 08/13/2016   MCV 74.4 (L) 08/13/2016   PLT 199 08/13/2016     Recent Labs Lab 08/12/16 1501 08/13/16 0339  NA 134* 134*  K 2.8* 3.0*  CL 96* 95*  CO2 33* 31  BUN 10 7  CREATININE 0.94  0.91 0.96  CALCIUM 8.3* 8.4*  PROT 5.8*  --   BILITOT 0.7  --   ALKPHOS 77  --   ALT 14*  --   AST 16  --   GLUCOSE 148* 110*  ALBUMIN 3.1*  --    Magnesium  Date Value Ref Range Status  08/13/2016 2.1 1.7 - 2.4 mg/dL Final    Recent Labs  16/10/96 1501  TROPONINI <0.03   B Natriuretic Peptide  Date/Time Value Ref Range Status  08/12/2016 03:01 PM 498.9 (H) 0.0 - 100.0 pg/mL Final   Lab Results  Component Value Date   CHOL 99 08/12/2016   HDL 36 (L) 08/12/2016   LDLCALC 36 08/12/2016   TRIG 137 08/12/2016   TSH  Date/Time Value Ref Range Status  01/11/2007 06:48 PM 2.196 0.350 - 5.50 microintl units/mL Final   Echo: performed, not read  ECG:  12/04 Atrial fib, slow VR w/ HR 44 RBBB seen on 2014 ECG  Cath: n/a  Radiology:  Dg Chest 2 View Result Date: 08/12/2016 CLINICAL DATA:  Initial evaluation for acute dyspnea, shortness of breath. EXAM: CHEST  2 VIEW COMPARISON:  None available. FINDINGS: Moderate cardiomegaly. Mediastinal silhouette within normal limits. Aortic  atherosclerosis noted. Lungs somewhat hypoinflated. Mild vascular congestion without overt pulmonary edema. Small bilateral pleural effusions. Associated bibasilar opacities favored to reflect effusion and/or atelectasis. Superimposed infiltrates not entirely excluded, particularly at the right lung base. No other focal airspace disease. No pneumothorax. No acute osseous abnormality. IMPRESSION: 1. Small layering bilateral pleural effusions with associated bibasilar opacities. Atelectasis is favored, although infiltrates could be considered in the correct clinical setting. 2. Moderate cardiomegaly with mild diffuse pulmonary vascular congestion without overt pulmonary edema. 3. Aortic atherosclerosis. Electronically Signed   By: Rise Mu M.D.   On: 08/12/2016 13:14    ASSESSMENT AND PLAN:   The patient was seen today by Dr Duke Salvia, the patient evaluated and the data reviewed.   Principal Problem: 1.  Pleural effusion - CHF, echo pending to determine the type - continue diuresis, dry weight unclear. - if pt does not diurese with Lasix 20 mg po qd, change to IV - follow BMET, needs more K+  Active Problems: 2.  Dyspnea - per IM and see above  3.  Atrial fibrillation (HCC) - unknown duration - pt was on atenolol 100 mg bid pta - slow VR on admission 40s - Atenolol held - will have to watch HR carefully, at risk for rebound tachycardia - continue to follow, may need BB in the future. - CHA2DS2VASc=4 (age x 2, HTN, CHF)  4.  Edema - see above, per IM  5.  Hyperglycemia - consider A1c, per IM  6.  Hypokalemia - had 80 meq yesterday, K+ went from 2.8->3.0 - give 120 meq today  7.  Absent pulse - Dr Leveda Anna noted this 1 year ago, pt never got Dopplers - ordered in-hospital.  8.  HTN:  -  Per IM - atenolol, HCTZ, and methyldopa d/c'd - on home rx of Norvasc 10 mg and enalapril 20 bid  Signed: Theodore Demark, PA-C 08/13/2016 12:42 PM Beeper [phone]  Co-Sign MD

## 2016-08-13 NOTE — Progress Notes (Signed)
ANTICOAGULATION CONSULT NOTE - Initial Consult  Pharmacy Consult for apixaban Indication: atrial fibrillation  Allergies  Allergen Reactions  . Asa [Aspirin] Shortness Of Breath and Other (See Comments)    Wheezing also (childhood allergy)  . Allopurinol Other (See Comments)    Unknown reaction  . Other Other (See Comments)    Develops migraines by strong smells    Patient Measurements: Height: 6' (182.9 cm) Weight: (!) 302 lb (137 kg) (scale c) IBW/kg (Calculated) : 77.6  Vital Signs: Temp: 98 F (36.7 C) (12/06 1346) Temp Source: Oral (12/06 1346) BP: 182/54 (12/06 1346) Pulse Rate: 68 (12/06 1346)  Labs:  Recent Labs  08/12/16 1501 08/13/16 0339  HGB 11.0*  11.2* 10.5*  HCT 33.7*  34.3* 32.8*  PLT 217  219 199  CREATININE 0.94  0.91 0.96  TROPONINI <0.03  --     Estimated Creatinine Clearance: 95.4 mL/min (by C-G formula based on SCr of 0.96 mg/dL).   Medical History: Past Medical History:  Diagnosis Date  . Cataract   . Gout   . Hypertension     Assessment: 75 y.o. year old male with a history of HTN, GERD, HLD, venous insufficiency, anxiety, claudication and ?OSA. RBBB on old ECG.  Patient noted to be in afib with slow VR, cardiology is starting apixaban for his afib. CHA2DS2VASc=4 (age x 2, HTN, CHF) No dose adjustments are needed at this time. He was receiving sq heparin will stop.    Goal of Therapy:  Monitor platelets by anticoagulation protocol: Yes   Plan:  Apixaban 5mg  bid Consult case management for med assistance  Sheppard CoilFrank Wilson PharmD., BCPS Clinical Pharmacist Pager [phone] 08/13/2016 5:22 PM

## 2016-08-14 ENCOUNTER — Encounter (HOSPITAL_COMMUNITY): Payer: Self-pay | Admitting: Cardiovascular Disease

## 2016-08-14 ENCOUNTER — Ambulatory Visit: Payer: Medicare Other | Admitting: Family Medicine

## 2016-08-14 DIAGNOSIS — I480 Paroxysmal atrial fibrillation: Secondary | ICD-10-CM

## 2016-08-14 DIAGNOSIS — E876 Hypokalemia: Secondary | ICD-10-CM

## 2016-08-14 DIAGNOSIS — I5033 Acute on chronic diastolic (congestive) heart failure: Secondary | ICD-10-CM

## 2016-08-14 DIAGNOSIS — I119 Hypertensive heart disease without heart failure: Secondary | ICD-10-CM | POA: Diagnosis present

## 2016-08-14 DIAGNOSIS — I110 Hypertensive heart disease with heart failure: Principal | ICD-10-CM

## 2016-08-14 DIAGNOSIS — I5032 Chronic diastolic (congestive) heart failure: Secondary | ICD-10-CM | POA: Diagnosis present

## 2016-08-14 HISTORY — DX: Hypertensive heart disease without heart failure: I11.9

## 2016-08-14 HISTORY — DX: Acute on chronic diastolic (congestive) heart failure: I50.33

## 2016-08-14 LAB — BASIC METABOLIC PANEL
ANION GAP: 10 (ref 5–15)
Anion gap: 11 (ref 5–15)
BUN: 6 mg/dL (ref 6–20)
BUN: 9 mg/dL (ref 6–20)
CHLORIDE: 95 mmol/L — AB (ref 101–111)
CHLORIDE: 93 mmol/L — AB (ref 101–111)
CO2: 29 mmol/L (ref 22–32)
CO2: 29 mmol/L (ref 22–32)
CREATININE: 0.86 mg/dL (ref 0.61–1.24)
CREATININE: 1.05 mg/dL (ref 0.61–1.24)
Calcium: 8.6 mg/dL — ABNORMAL LOW (ref 8.9–10.3)
Calcium: 8.2 mg/dL — ABNORMAL LOW (ref 8.9–10.3)
GFR calc Af Amer: >60 mL/min (ref >60)
GFR calc non Af Amer: >60 mL/min (ref >60)
GLUCOSE: 121 mg/dL — AB (ref 65–99)
Glucose, Bld: 125 mg/dL — ABNORMAL HIGH (ref 65–99)
POTASSIUM: 3.3 mmol/L — AB (ref 3.5–5.1)
Potassium: 3.1 mmol/L — ABNORMAL LOW (ref 3.5–5.1)
SODIUM: 135 mmol/L (ref 135–145)
Sodium: 132 mmol/L — ABNORMAL LOW (ref 135–145)

## 2016-08-14 LAB — T4, FREE: Free T4: 1.46 ng/dL — ABNORMAL HIGH (ref 0.61–1.12)

## 2016-08-14 LAB — TSH: TSH: 2.08 u[IU]/mL (ref 0.350–4.500)

## 2016-08-14 MED ORDER — SPIRONOLACTONE 25 MG PO TABS: 50.0000 mg | ORAL_TABLET | Freq: Every day | ORAL | Status: DC

## 2016-08-14 MED ORDER — ATORVASTATIN CALCIUM 40 MG PO TABS
40.0000 mg | ORAL_TABLET | Freq: Every day | ORAL | Status: DC
  Administered 2016-08-14 – 2016-08-21 (×8): 40 mg via ORAL
  Filled 2016-08-14 (×8): qty 1

## 2016-08-14 MED ORDER — HYDRALAZINE HCL 50 MG PO TABS
100.0000 mg | ORAL_TABLET | Freq: Three times a day (TID) | ORAL | Status: DC
  Administered 2016-08-14 – 2016-08-21 (×22): 100 mg via ORAL
  Filled 2016-08-14 (×23): qty 2

## 2016-08-14 MED ORDER — FEBUXOSTAT 40 MG PO TABS
40.0000 mg | ORAL_TABLET | Freq: Every day | ORAL | Status: DC
  Administered 2016-08-14 – 2016-08-21 (×8): 40 mg via ORAL
  Filled 2016-08-14 (×8): qty 1

## 2016-08-14 MED ORDER — POTASSIUM CHLORIDE CRYS ER 20 MEQ PO TBCR
40.0000 meq | EXTENDED_RELEASE_TABLET | Freq: Two times a day (BID) | ORAL | Status: AC
  Administered 2016-08-14: 40 meq via ORAL
  Filled 2016-08-14: qty 2

## 2016-08-14 MED ORDER — SPIRONOLACTONE 25 MG PO TABS
50.0000 mg | ORAL_TABLET | Freq: Every day | ORAL | Status: DC
  Administered 2016-08-14 – 2016-08-18 (×5): 50 mg via ORAL
  Filled 2016-08-14 (×5): qty 2

## 2016-08-14 MED ORDER — FUROSEMIDE 10 MG/ML IJ SOLN
60.0000 mg | Freq: Two times a day (BID) | INTRAMUSCULAR | Status: DC
  Administered 2016-08-14 – 2016-08-20 (×12): 60 mg via INTRAVENOUS
  Filled 2016-08-14 (×12): qty 6

## 2016-08-14 MED ORDER — POTASSIUM CHLORIDE CRYS ER 20 MEQ PO TBCR: 40.0000 meq | EXTENDED_RELEASE_TABLET | Freq: Two times a day (BID) | ORAL | Status: DC

## 2016-08-14 MED ORDER — CARVEDILOL 6.25 MG PO TABS
6.2500 mg | ORAL_TABLET | Freq: Two times a day (BID) | ORAL | Status: DC
  Administered 2016-08-14 – 2016-08-15 (×2): 6.25 mg via ORAL
  Filled 2016-08-14 (×2): qty 1

## 2016-08-14 MED ORDER — ALUM & MAG HYDROXIDE-SIMETH 200-200-20 MG/5ML PO SUSP
30.0000 mL | Freq: Once | ORAL | Status: AC
  Administered 2016-08-14: 30 mL via ORAL
  Filled 2016-08-14: qty 30

## 2016-08-14 NOTE — Progress Notes (Signed)
Family Medicine Teaching Service Daily Progress Note Intern Pager: [phone]  [facility] number: [id number] Date of birth: 1940-10-15 Age: 75 y.o. Gender: male  Primary Care Provider: Sanjuana LettersHENSEL,WILLIAM ARTHUR, MD Consultants: None Code Status: Full  Pt Overview and Major Events to Date:  1. Admit to FMTS 08/12/16  Assessment and Plan: Shane Mathews is a 75 y.o. male presenting with exertional SOB . PMH is significant for HTN, chronic venous insufficiency, Anxiety, GERD, Claudication, Hyperlipidemia  Dyspnea on Exertion:   Presentation is concerning for new onset heart failure given history of orthopnea and BNP of 498.9 and finding of vascular congestion on chest x-ray, pitting edema in lower extremities and bibasilar crackles. Seen by cardiology increased lasix to 40mg  IV BID IV and has 2.2 L UOP past 24hrs.  Weight down 15 pounds. Continues faint bibasilar crackles on exam.  Echo showed normal systolic function with normal EF.  - Strict I's and O's - Daily weights - Continue 40 mg IV Lasix BID  New onset afb:  Noted on EKG.  Patient continues to endorses dyspnea with exertion.  Denies palpitations or CP.  No history of syncope or CVA/TIA.  Did not appreciate abnormal rhythm on exam this morning.  Continues to be bradycardic and currently holding BB.  Added apixaban 5mg  BID and plan to continue to diurese patient and if no improvement in symptoms proceed with TEE/DCCV.  - cardiology consulted; appreciate recommendations - apixaban 5mg  BID - continue to monitor SOB - consider TEE/DCCV  Cough: Productive cough clear sputum for past 6 mo months.  Former smoker quit over 20 years ago.  No sick contacts and denies any chest pain, fevers or upper respiratory symptoms.  Does have history of GERD and takes Pepcid.  Also has a history of childhood asthma with unknown PFT results.  Does not use any inhalers. Chest x-ray showed bilateral pleural effusions and  vascular congestion and lungs were actually hypoinflated.   - Continue to monitor - Tessalon Perles 100mg  TID PRN - Consider albuterol PRN and PFTs  Anemia: Hemoglobin of 11.2 noted on admission. MCV 73. Due for colonoscopy. FOBT negative in 08/2015.  - Monitor on CBC - Consider Iron studies if no improvement  Hypertension: On six BP meds at home.  Holding HCTZ due to recent history of gout and holding BB due to bradycardia.  BP this morning 175/57.  Cardiology added hydralazine 50mg  TID yesterday. Not much improvement.  - increased spironolactone to 50mg  daily-- will also help with K+ - continue norvasc 10mg  daily, enalapril 20mg  BID and hydralazine 50mg  TID - continue to monitor - consider renal doppler  History of gout: Stable -changed to febuxostat 40mg  daily  Hypokalemia: 2.8 upon presentation. 3.1 this AM.  Magnesium wnl. - Repleted with 40kdur BID (1 dose) - f/u BMP - added spironolactone 25mg  BID  Panic Attacks: Stable. Last documentation and office visit was in 2014. Currently only on Paxil 20 mg daily.   - Continue Paxil 20 mg daily - add ativan 0.5mg  BID q4hrs PRN  Hypertriglyceridemia: pravastatin 40mg  daily.  Lipid panel showed LDL 36, otherwise normal. - Continue pravastatin 40 mg  GERD: Reportedly takes pepcid - Protonix 20mg  daily  Intermittent Claudication:  Reported claudication in bilateral lower extremities in the past. ABIs show moderate arterial disease.  Patient denies current symptoms.  - continue prevastatin 40mg  daily.  - ABI: R: 0.71, L: 0.69.  - Consider TED hose  FEN/GI: Heart healthy diet/ protonix Prophylaxis: subQ heparin  Disposition: discharge home when stable  Subjective:  Patient feels well this morning and denies CP or palpitations.  Continues to have SOB with exertion.  Feels improved after diuresis.   Objective: Temp:  [97.8 F (36.6 C)-98 F (36.7 C)] 97.9 F (36.6 C) (12/07 0500) Pulse Rate:  [68-78] 78 (12/07  0500) Resp:  [18] 18 (12/07 0500) BP: (175-182)/(54-67) 175/57 (12/07 0500) SpO2:  [97 %-100 %] 97 % (12/07 0500) Weight:  [292 lb 3.2 oz (132.5 kg)] 292 lb 3.2 oz (132.5 kg) (12/07 0500) Physical Exam: General: 75 year old male sitting up in bed appearing comfortable ENTM: MMM Cardiovascular: Bradycardic, regular rhythm no murmurs rubs or gallops heard Respiratory: NWOB, faint bibasilar crackles, no wheezing or rhonci Gastrointestinal: soft, NTND MSK: No deformities, moves all extremities, no cyanosis or clubbing Extremities: 3+ pitting edema in lower extremities up to the knees bilaterally Derm: Venous stasis changes seen on bilateral lower extremities  Neuro: AAOx3 Psych: Normal mood and affect  Laboratory:  Recent Labs Lab 08/12/16 1501 08/13/16 0339  WBC 8.8  8.8 7.8  HGB 11.0*  11.2* 10.5*  HCT 33.7*  34.3* 32.8*  PLT 217  219 199    Recent Labs Lab 08/12/16 1501 08/13/16 0339 08/14/16 0218  NA 134* 134* 135  K 2.8* 3.0* 3.1*  CL 96* 95* 95*  CO2 33* 31 29  BUN 10 7 6   CREATININE 0.94  0.91 0.96 0.86  CALCIUM 8.3* 8.4* 8.6*  PROT 5.8*  --   --   BILITOT 0.7  --   --   ALKPHOS 77  --   --   ALT 14*  --   --   AST 16  --   --   GLUCOSE 148* 110* 121*    Imaging/Diagnostic Tests: No results found.  Renne Muscaaniel L Naomie Crow, MD 08/14/2016, 7:36 AM PGY-1, [facility] FPTS Intern pager: [phone], text pages welcome

## 2016-08-14 NOTE — Progress Notes (Signed)
OT Cancellation Note  Patient Details Name: Shane Mathews MRN: [id number] DOB: 05/30/1941   Cancelled Treatment:    Reason Eval/Treat Not Completed: Patient declined, no reason specified .  Pt states he has stood and used commode.  He does not want to participate in OT this afternoon.  Pt states breathing is better, and he knows he needs to be more active but he wants to save his strength for when he needs to use commode again later.  Talked to him about moving more for short bursts of activity to build endurance.  Will check back tomorrow. Jizel Cheeks 08/14/2016, 3:12 PM  Marica OtterMaryellen Alonzo Owczarzak, OTR/L [phone] 08/14/2016

## 2016-08-14 NOTE — Discharge Instructions (Addendum)
Spironolactone tablets What is this medicine? SPIRONOLACTONE ([place] LAK tone) is a diuretic. It helps you make more urine and to lose excess water from your body. This medicine is used to treat high blood pressure, and edema or swelling from heart, kidney, or liver disease. It is also used to treat patients who make too much aldosterone or have low potassium. This medicine may be used for other purposes; ask your health care provider or pharmacist if you have questions. COMMON BRAND NAME(S): Aldactone What should I tell my health care provider before I take this medicine? They need to know if you have any of these conditions: -high blood level of potassium -kidney disease or trouble making urine -liver disease -an unusual or allergic reaction to spironolactone, other medicines, foods, dyes, or preservatives -pregnant or trying to get pregnant -breast-feeding How should I use this medicine? Take this medicine by mouth with a drink of water. Follow the directions on your prescription label. You can take it with or without food. If it upsets your stomach, take it with food. Do not take your medicine more often than directed. Remember that you will need to pass more urine after taking this medicine. Do not take your doses at a time of day that will cause you problems. Do not take at bedtime. Talk to your pediatrician regarding the use of this medicine in children. While this drug may be prescribed for selected conditions, precautions do apply. Overdosage: If you think you have taken too much of this medicine contact a poison control center or emergency room at once. NOTE: This medicine is only for you. Do not share this medicine with others. What if I miss a dose? If you miss a dose, take it as soon as you can. If it is almost time for your next dose, take only that dose. Do not take double or extra doses. What may interact with this medicine? Do not take this medicine with any of the  following medications: -eplerenone This medicine may also interact with the following medications: -corticosteroids -digoxin -lithium -medicines for high blood pressure like ACE inhibitors -skeletal muscle relaxants like tubocurarine -NSAIDs, medicines for pain and inflammation, like ibuprofen or naproxen -potassium products like salt substitute or supplements -pressor amines like norepinephrine -some diuretics This list may not describe all possible interactions. Give your health care provider a list of all the medicines, herbs, non-prescription drugs, or dietary supplements you use. Also tell them if you smoke, drink alcohol, or use illegal drugs. Some items may interact with your medicine. What should I watch for while using this medicine? Visit your doctor or health care professional for regular checks on your progress. Check your blood pressure as directed. Ask your doctor what your blood pressure should be, and when you should contact them. You may need to be on a special diet while taking this medicine. Ask your doctor. Also, ask how many glasses of fluid you need to drink a day. You must not get dehydrated. This medicine may make you feel confused, dizzy or lightheaded. Drinking alcohol and taking some medicines can make this worse. Do not drive, use machinery, or do anything that needs mental alertness until you know how this medicine affects you. Do not sit or stand up quickly. What side effects may I notice from receiving this medicine? Side effects that you should report to your doctor or health care professional as soon as possible: -allergic reactions such as skin rash or itching, hives, swelling of the  lips, mouth, tongue, or throat -black or tarry stools -fast, irregular heartbeat -fever -muscle pain, cramps -numbness, tingling in hands or feet -trouble breathing -trouble passing urine -unusual bleeding -unusually weak or tired Side effects that usually do not require  medical attention (report to your doctor or health care professional if they continue or are bothersome): -change in voice or hair growth -confusion -dizzy, drowsy -dry mouth, increased thirst -enlarged or tender breasts -headache -irregular menstrual periods -sexual difficulty, unable to have an erection -stomach upset This list may not describe all possible side effects. Call your doctor for medical advice about side effects. You may report side effects to FDA at 1-800-FDA-1088. Where should I keep my medicine? Keep out of the reach of children. Store below 25 degrees C (77 degrees F). Throw away any unused medicine after the expiration date. NOTE: This sheet is a summary. It may not cover all possible information. If you have questions about this medicine, talk to your doctor, pharmacist, or health care provider.  2017 Elsevier/Gold Standard (2010-05-07 12:51:30) Prednisone tablets What is this medicine? PREDNISONE (PRED ni sone) is a corticosteroid. It is commonly used to treat inflammation of the skin, joints, lungs, and other organs. Common conditions treated include asthma, allergies, and arthritis. It is also used for other conditions, such as blood disorders and diseases of the adrenal glands. This medicine may be used for other purposes; ask your health care provider or pharmacist if you have questions. COMMON BRAND NAME(S): Deltasone, Predone, Sterapred, Sterapred DS What should I tell my health care provider before I take this medicine? They need to know if you have any of these conditions: -Cushing's syndrome -diabetes -glaucoma -heart disease -high blood pressure -infection (especially a virus infection such as chickenpox, cold sores, or herpes) -kidney disease -liver disease -mental illness -myasthenia gravis -osteoporosis -seizures -stomach or intestine problems -thyroid disease -an unusual or allergic reaction to lactose, prednisone, other medicines, foods,  dyes, or preservatives -pregnant or trying to get pregnant -breast-feeding How should I use this medicine? Take this medicine by mouth with a glass of water. Follow the directions on the prescription label. Take this medicine with food. If you are taking this medicine once a day, take it in the morning. Do not take more medicine than you are told to take. Do not suddenly stop taking your medicine because you may develop a severe reaction. Your doctor will tell you how much medicine to take. If your doctor wants you to stop the medicine, the dose may be slowly lowered over time to avoid any side effects. Talk to your pediatrician regarding the use of this medicine in children. Special care may be needed. Overdosage: If you think you have taken too much of this medicine contact a poison control center or emergency room at once. NOTE: This medicine is only for you. Do not share this medicine with others. What if I miss a dose? If you miss a dose, take it as soon as you can. If it is almost time for your next dose, talk to your doctor or health care professional. You may need to miss a dose or take an extra dose. Do not take double or extra doses without advice. What may interact with this medicine? Do not take this medicine with any of the following medications: -metyrapone -mifepristone This medicine may also interact with the following medications: -aminoglutethimide -amphotericin B -aspirin and aspirin-like medicines -barbiturates -certain medicines for diabetes, like glipizide or glyburide -cholestyramine -cholinesterase inhibitors -cyclosporine -digoxin -diuretics -  ephedrine -male hormones, like estrogens and birth control pills -isoniazid -ketoconazole -NSAIDS, medicines for pain and inflammation, like ibuprofen or naproxen -phenytoin -rifampin -toxoids -vaccines -warfarin This list may not describe all possible interactions. Give your health care provider a list of all the  medicines, herbs, non-prescription drugs, or dietary supplements you use. Also tell them if you smoke, drink alcohol, or use illegal drugs. Some items may interact with your medicine. What should I watch for while using this medicine? Visit your doctor or health care professional for regular checks on your progress. If you are taking this medicine over a prolonged period, carry an identification card with your name and address, the type and dose of your medicine, and your doctor's name and address. This medicine may increase your risk of getting an infection. Tell your doctor or health care professional if you are around anyone with measles or chickenpox, or if you develop sores or blisters that do not heal properly. If you are going to have surgery, tell your doctor or health care professional that you have taken this medicine within the last twelve months. Ask your doctor or health care professional about your diet. You may need to lower the amount of salt you eat. This medicine may affect blood sugar levels. If you have diabetes, check with your doctor or health care professional before you change your diet or the dose of your diabetic medicine. What side effects may I notice from receiving this medicine? Side effects that you should report to your doctor or health care professional as soon as possible: -allergic reactions like skin rash, itching or hives, swelling of the face, lips, or tongue -changes in emotions or moods -changes in vision -depressed mood -eye pain -fever or chills, cough, sore throat, pain or difficulty passing urine -increased thirst -swelling of ankles, feet Side effects that usually do not require medical attention (report to your doctor or health care professional if they continue or are bothersome): -confusion, excitement, restlessness -headache -nausea, vomiting -skin problems, acne, thin and shiny skin -trouble sleeping -weight gain This list may not describe all  possible side effects. Call your doctor for medical advice about side effects. You may report side effects to FDA at 1-800-FDA-1088. Where should I keep my medicine? Keep out of the reach of children. Store at room temperature between 15 and 30 degrees C (59 and 86 degrees F). Protect from light. Keep container tightly closed. Throw away any unused medicine after the expiration date. NOTE: This sheet is a summary. It may not cover all possible information. If you have questions about this medicine, talk to your doctor, pharmacist, or health care provider.  2017 Elsevier/Gold Standard (2011-04-10 10:57:14) Hydralazine tablets What is this medicine? HYDRALAZINE (hye DRAL a zeen) is a type of vasodilator. It relaxes blood vessels, increasing the blood and oxygen supply to your heart. This medicine is used to treat high blood pressure. This medicine may be used for other purposes; ask your health care provider or pharmacist if you have questions. COMMON BRAND NAME(S): Apresoline What should I tell my health care provider before I take this medicine? They need to know if you have any of these conditions: -blood vessel disease -heart disease including angina or history of heart attack -kidney or liver disease -systemic lupus erythematosus (SLE) -an unusual or allergic reaction to hydralazine, tartrazine dye, other medicines, foods, dyes, or preservatives -pregnant or trying to get pregnant -breast-feeding How should I use this medicine? Take this medicine by mouth with a  glass of water. Follow the directions on the prescription label. Take your doses at regular intervals. Do not take your medicine more often than directed. Do not stop taking except on the advice of your doctor or health care professional. Talk to your pediatrician regarding the use of this medicine in children. Special care may be needed. While this drug may be prescribed for children for selected conditions, precautions do  apply. Overdosage: If you think you have taken too much of this medicine contact a poison control center or emergency room at once. NOTE: This medicine is only for you. Do not share this medicine with others. What if I miss a dose? If you miss a dose, take it as soon as you can. If it is almost time for your next dose, take only that dose. Do not take double or extra doses. What may interact with this medicine? -medicines for high blood pressure -medicines for mental depression This list may not describe all possible interactions. Give your health care provider a list of all the medicines, herbs, non-prescription drugs, or dietary supplements you use. Also tell them if you smoke, drink alcohol, or use illegal drugs. Some items may interact with your medicine. What should I watch for while using this medicine? Visit your doctor or health care professional for regular checks on your progress. Check your blood pressure and pulse rate regularly. Ask your doctor or health care professional what your blood pressure and pulse rate should be and when you should contact him or her. You may get drowsy or dizzy. Do not drive, use machinery, or do anything that needs mental alertness until you know how this medicine affects you. Do not stand or sit up quickly, especially if you are an older patient. This reduces the risk of dizzy or fainting spells. Alcohol may interfere with the effect of this medicine. Avoid alcoholic drinks. Do not treat yourself for coughs, colds, or pain while you are taking this medicine without asking your doctor or health care professional for advice. Some ingredients may increase your blood pressure. What side effects may I notice from receiving this medicine? Side effects that you should report to your doctor or health care professional as soon as possible: -chest pain, or fast or irregular heartbeat -fever, chills, or sore throat -numbness or tingling in the hands or feet -shortness  of breath -skin rash, redness, blisters or itching -stiff or swollen joints -sudden weight gain -swelling of the feet or legs -swollen lymph glands -unusual weakness Side effects that usually do not require medical attention (report to your doctor or health care professional if they continue or are bothersome): -diarrhea, or constipation -headache -loss of appetite -nausea, vomiting This list may not describe all possible side effects. Call your doctor for medical advice about side effects. You may report side effects to FDA at 1-800-FDA-1088. Where should I keep my medicine? Keep out of the reach of children. Store at room temperature between 15 and 30 degrees C (59 and 86 degrees F). Throw away any unused medicine after the expiration date. NOTE: This sheet is a summary. It may not cover all possible information. If you have questions about this medicine, talk to your doctor, pharmacist, or health care provider.  2017 Elsevier/Gold Standard (2008-01-07 15:44:58) Furosemide tablets What is this medicine? FUROSEMIDE (fyoor [place] se mide) is a diuretic. It helps you make more urine and to lose salt and excess water from your body. This medicine is used to treat high blood pressure, and  edema or swelling from heart, kidney, or liver disease. This medicine may be used for other purposes; ask your health care provider or pharmacist if you have questions. COMMON BRAND NAME(S): Active-Medicated Specimen Kit, Delone, Diuscreen, Lasix, RX Specimen Collection Kit, Specimen Collection Kit, URINX Medicated Specimen Collection What should I tell my health care provider before I take this medicine? They need to know if you have any of these conditions: -abnormal blood electrolytes -diarrhea or vomiting -gout -heart disease -kidney disease, small amounts of urine, or difficulty passing urine -liver disease -thyroid disease -an unusual or allergic reaction to furosemide, sulfa drugs, other medicines,  foods, dyes, or preservatives -pregnant or trying to get pregnant -breast-feeding How should I use this medicine? Take this medicine by mouth with a glass of water. Follow the directions on the prescription label. You may take this medicine with or without food. If it upsets your stomach, take it with food or milk. Do not take your medicine more often than directed. Remember that you will need to pass more urine after taking this medicine. Do not take your medicine at a time of day that will cause you problems. Do not take at bedtime. Talk to your pediatrician regarding the use of this medicine in children. While this drug may be prescribed for selected conditions, precautions do apply. Overdosage: If you think you have taken too much of this medicine contact a poison control center or emergency room at once. NOTE: This medicine is only for you. Do not share this medicine with others. What if I miss a dose? If you miss a dose, take it as soon as you can. If it is almost time for your next dose, take only that dose. Do not take double or extra doses. What may interact with this medicine? -aspirin and aspirin-like medicines -certain antibiotics -chloral hydrate -cisplatin -cyclosporine -digoxin -diuretics -laxatives -lithium -medicines for blood pressure -medicines that relax muscles for surgery -methotrexate -NSAIDs, medicines for pain and inflammation like ibuprofen, naproxen, or indomethacin -phenytoin -steroid medicines like prednisone or cortisone -sucralfate -thyroid hormones This list may not describe all possible interactions. Give your health care provider a list of all the medicines, herbs, non-prescription drugs, or dietary supplements you use. Also tell them if you smoke, drink alcohol, or use illegal drugs. Some items may interact with your medicine. What should I watch for while using this medicine? Visit your doctor or health care professional for regular checks on your  progress. Check your blood pressure regularly. Ask your doctor or health care professional what your blood pressure should be, and when you should contact him or her. If you are a diabetic, check your blood sugar as directed. You may need to be on a special diet while taking this medicine. Check with your doctor. Also, ask how many glasses of fluid you need to drink a day. You must not get dehydrated. You may get drowsy or dizzy. Do not drive, use machinery, or do anything that needs mental alertness until you know how this drug affects you. Do not stand or sit up quickly, especially if you are an older patient. This reduces the risk of dizzy or fainting spells. Alcohol can make you more drowsy and dizzy. Avoid alcoholic drinks. This medicine can make you more sensitive to the sun. Keep out of the sun. If you cannot avoid being in the sun, wear protective clothing and use sunscreen. Do not use sun lamps or tanning beds/booths. What side effects may I notice from receiving this  medicine? Side effects that you should report to your doctor or health care professional as soon as possible: -blood in urine or stools -dry mouth -fever or chills -hearing loss or ringing in the ears -irregular heartbeat -muscle pain or weakness, cramps -skin rash -stomach upset, pain, or nausea -tingling or numbness in the hands or feet -unusually weak or tired -vomiting or diarrhea -yellowing of the eyes or skin Side effects that usually do not require medical attention (report to your doctor or health care professional if they continue or are bothersome): -headache -loss of appetite -unusual bleeding or bruising This list may not describe all possible side effects. Call your doctor for medical advice about side effects. You may report side effects to FDA at 1-800-FDA-1088. Where should I keep my medicine? Keep out of the reach of children. Store at room temperature between 15 and 30 degrees C (59 and 86 degrees F).  Protect from light. Throw away any unused medicine after the expiration date. NOTE: This sheet is a summary. It may not cover all possible information. If you have questions about this medicine, talk to your doctor, pharmacist, or health care provider.  2017 [employer]/Gold Standard (2014-11-15 13:49:50) Carvedilol tablets What is this medicine? CARVEDILOL (KAR ve dil ol) is a beta-blocker. Beta-blockers reduce the workload on the heart and help it to beat more regularly. This medicine is used to treat high blood pressure and heart failure. This medicine may be used for other purposes; ask your health care provider or pharmacist if you have questions. COMMON BRAND NAME(S): Coreg What should I tell my health care provider before I take this medicine? They need to know if you have any of these conditions: -circulation problems -diabetes -history of heart attack or heart disease -liver disease -lung or breathing disease, like asthma or emphysema -pheochromocytoma -slow or irregular heartbeat -thyroid disease -an unusual or allergic reaction to carvedilol, other beta-blockers, medicines, foods, dyes, or preservatives -pregnant or trying to get pregnant -breast-feeding How should I use this medicine? Take this medicine by mouth with a glass of water. Follow the directions on the prescription label. It is best to take the tablets with food. Take your doses at regular intervals. Do not take your medicine more often than directed. Do not stop taking except on the advice of your doctor or health care professional. Talk to your pediatrician regarding the use of this medicine in children. Special care may be needed. Overdosage: If you think you have taken too much of this medicine contact a poison control center or emergency room at once. NOTE: This medicine is only for you. Do not share this medicine with others. What if I miss a dose? If you miss a dose, take it as soon as you can. If it is almost  time for your next dose, take only that dose. Do not take double or extra doses. What may interact with this medicine? This medicine may interact with the following medications: -certain medicines for blood pressure, heart disease, irregular heart beat -certain medicines for depression, like fluoxetine or paroxetine -certain medicines for diabetes, like glipizide or glyburide -cimetidine -clonidine -cyclosporine -digoxin -MAOIs like Carbex, Eldepryl, Marplan, Nardil, and Parnate -reserpine -rifampin This list may not describe all possible interactions. Give your health care provider a list of all the medicines, herbs, non-prescription drugs, or dietary supplements you use. Also tell them if you smoke, drink alcohol, or use illegal drugs. Some items may interact with your medicine. What should I watch for while using  this medicine? Check your heart rate and blood pressure regularly while you are taking this medicine. Ask your doctor or health care professional what your heart rate and blood pressure should be, and when you should contact him or her. Do not stop taking this medicine suddenly. This could lead to serious heart-related effects. Contact your doctor or health care professional if you have difficulty breathing while taking this drug. Check your weight daily. Ask your doctor or health care professional when you should notify him/her of any weight gain. You may get drowsy or dizzy. Do not drive, use machinery, or do anything that requires mental alertness until you know how this medicine affects you. To reduce the risk of dizzy or fainting spells, do not sit or stand up quickly. Alcohol can make you more drowsy, and increase flushing and rapid heartbeats. Avoid alcoholic drinks. If you have diabetes, check your blood sugar as directed. Tell your doctor if you have changes in your blood sugar while you are taking this medicine. If you are going to have surgery, tell your doctor or health  care professional that you are taking this medicine. What side effects may I notice from receiving this medicine? Side effects that you should report to your doctor or health care professional as soon as possible: -allergic reactions like skin rash, itching or hives, swelling of the face, lips, or tongue -breathing problems -dark urine -irregular heartbeat -swollen legs or ankles -vomiting -yellowing of the eyes or skin Side effects that usually do not require medical attention (report to your doctor or health care professional if they continue or are bothersome): -change in sex drive or performance -diarrhea -dry eyes (especially if wearing contact lenses) -dry, itching skin -headache -nausea -unusually tired This list may not describe all possible side effects. Call your doctor for medical advice about side effects. You may report side effects to FDA at 1-800-FDA-1088. Where should I keep my medicine? Keep out of the reach of children. Store at room temperature below 30 degrees C (86 degrees F). Protect from moisture. Keep container tightly closed. Throw away any unused medicine after the expiration date. NOTE: This sheet is a summary. It may not cover all possible information. If you have questions about this medicine, talk to your doctor, pharmacist, or health care provider.  2017 Elsevier/Gold Standard (2013-05-01 14:12:02) Atorvastatin tablets What is this medicine? ATORVASTATIN (a TORE va sta tin) is known as a HMG-CoA reductase inhibitor or 'statin'. It lowers the level of cholesterol and triglycerides in the blood. This drug may also reduce the risk of heart attack, stroke, or other health problems in patients with risk factors for heart disease. Diet and lifestyle changes are often used with this drug. This medicine may be used for other purposes; ask your health care provider or pharmacist if you have questions. COMMON BRAND NAME(S): Lipitor What should I tell my health care  provider before I take this medicine? They need to know if you have any of these conditions: -frequently drink alcoholic beverages -history of stroke, TIA -kidney disease -liver disease -muscle aches or weakness -other medical condition -an unusual or allergic reaction to atorvastatin, other medicines, foods, dyes, or preservatives -pregnant or trying to get pregnant -breast-feeding How should I use this medicine? Take this medicine by mouth with a glass of water. Follow the directions on the prescription label. You can take this medicine with or without food. Take your doses at regular intervals. Do not take your medicine more often than directed. Talk  to your pediatrician regarding the use of this medicine in children. While this drug may be prescribed for children as young as 74 years old for selected conditions, precautions do apply. Overdosage: If you think you have taken too much of this medicine contact a poison control center or emergency room at once. NOTE: This medicine is only for you. Do not share this medicine with others. What if I miss a dose? If you miss a dose, take it as soon as you can. If it is almost time for your next dose, take only that dose. Do not take double or extra doses. What may interact with this medicine? Do not take this medicine with any of the following medications: -red yeast rice -telaprevir -telithromycin -voriconazole This medicine may also interact with the following medications: -alcohol -antiviral medicines for HIV or AIDS -boceprevir -certain antibiotics like clarithromycin, erythromycin, troleandomycin -certain medicines for cholesterol like fenofibrate or gemfibrozil -cimetidine -clarithromycin -colchicine -cyclosporine -digoxin -male hormones, like estrogens or progestins and birth control pills -grapefruit juice -medicines for fungal infections like fluconazole, itraconazole, ketoconazole -niacin -rifampin -spironolactone This  list may not describe all possible interactions. Give your health care provider a list of all the medicines, herbs, non-prescription drugs, or dietary supplements you use. Also tell them if you smoke, drink alcohol, or use illegal drugs. Some items may interact with your medicine. What should I watch for while using this medicine? Visit your doctor or health care professional for regular check-ups. You may need regular tests to make sure your liver is working properly. Tell your doctor or health care professional right away if you get any unexplained muscle pain, tenderness, or weakness, especially if you also have a fever and tiredness. Your doctor or health care professional may tell you to stop taking this medicine if you develop muscle problems. If your muscle problems do not go away after stopping this medicine, contact your health care professional. This drug is only part of a total heart-health program. Your doctor or a dietician can suggest a low-cholesterol and low-fat diet to help. Avoid alcohol and smoking, and keep a proper exercise schedule. Do not use this drug if you are pregnant or breast-feeding. Serious side effects to an unborn child or to an infant are possible. Talk to your doctor or pharmacist for more information. This medicine may affect blood sugar levels. If you have diabetes, check with your doctor or health care professional before you change your diet or the dose of your diabetic medicine. If you are going to have surgery tell your health care professional that you are taking this drug. What side effects may I notice from receiving this medicine? Side effects that you should report to your doctor or health care professional as soon as possible: -allergic reactions like skin rash, itching or hives, swelling of the face, lips, or tongue -dark urine -fever -joint pain -muscle cramps, pain -redness, blistering, peeling or loosening of the skin, including inside the  mouth -trouble passing urine or change in the amount of urine -unusually weak or tired -yellowing of eyes or skin Side effects that usually do not require medical attention (report to your doctor or health care professional if they continue or are bothersome): -constipation -heartburn -stomach gas, pain, upset This list may not describe all possible side effects. Call your doctor for medical advice about side effects. You may report side effects to FDA at 1-800-FDA-1088. Where should I keep my medicine? Keep out of the reach of children. Store at room  temperature between 20 to 25 degrees C (68 to 77 degrees F). Throw away any unused medicine after the expiration date. NOTE: This sheet is a summary. It may not cover all possible information. If you have questions about this medicine, talk to your doctor, pharmacist, or health care provider.  2017 Elsevier/Gold Standard (2011-07-15 35:45:62) Apixaban oral tablets What is this medicine? APIXABAN (a PIX a ban) is an anticoagulant (blood thinner). It is used to lower the chance of stroke in people with a medical condition called atrial fibrillation. It is also used to treat or prevent blood clots in the lungs or in the veins. COMMON BRAND NAME(S): Eliquis What should I tell my health care provider before I take this medicine? They need to know if you have any of these conditions: -bleeding disorders -bleeding in the brain -blood in your stools (black or tarry stools) or if you have blood in your vomit -history of stomach bleeding -kidney disease -liver disease -mechanical heart valve -an unusual or allergic reaction to apixaban, other medicines, foods, dyes, or preservatives -pregnant or trying to get pregnant -breast-feeding How should I use this medicine? Take this medicine by mouth with a glass of water. Follow the directions on the prescription label. You can take it with or without food. If it upsets your stomach, take it with food.  Take your medicine at regular intervals. Do not take it more often than directed. Do not stop taking except on your doctor's advice. Stopping this medicine may increase your risk of a blot clot. Be sure to refill your prescription before you run out of medicine. Talk to your pediatrician regarding the use of this medicine in children. Special care may be needed. What if I miss a dose? If you miss a dose, take it as soon as you can. If it is almost time for your next dose, take only that dose. Do not take double or extra doses. What may interact with this medicine? This medicine may interact with the following: -aspirin and aspirin-like medicines -certain medicines for fungal infections like ketoconazole and itraconazole -certain medicines for seizures like carbamazepine and phenytoin -certain medicines that treat or prevent blood clots like warfarin, enoxaparin, and dalteparin -clarithromycin -NSAIDs, medicines for pain and inflammation, like ibuprofen or naproxen -rifampin -ritonavir -St. John's wort What should I watch for while using this medicine? Notify your doctor or health care professional and seek emergency treatment if you develop breathing problems; changes in vision; chest pain; severe, sudden headache; pain, swelling, warmth in the leg; trouble speaking; sudden numbness or weakness of the face, arm, or leg. These can be signs that your condition has gotten worse. If you are going to have surgery, tell your doctor or health care professional that you are taking this medicine. Tell your health care professional that you use this medicine before you have a spinal or epidural procedure. Sometimes people who take this medicine have bleeding problems around the spine when they have a spinal or epidural procedure. This bleeding is very rare. If you have a spinal or epidural procedure while on this medicine, call your health care professional immediately if you have back pain, numbness or  tingling (especially in your legs and feet), muscle weakness, paralysis, or loss of bladder or bowel control. Avoid sports and activities that might cause injury while you are using this medicine. Severe falls or injuries can cause unseen bleeding. Be careful when using sharp tools or knives. Consider using an Copy. Take special care brushing  or flossing your teeth. Report any injuries, bruising, or red spots on the skin to your doctor or health care professional. What side effects may I notice from receiving this medicine? Side effects that you should report to your doctor or health care professional as soon as possible: -allergic reactions like skin rash, itching or hives, swelling of the face, lips, or tongue -signs and symptoms of bleeding such as bloody or black, tarry stools; red or dark-brown urine; spitting up blood or brown material that looks like coffee grounds; red spots on the skin; unusual bruising or bleeding from the eye, gums, or nose Where should I keep my medicine? Keep out of the reach of children. Store at room temperature between 20 and 25 degrees C (68 and 77 degrees F). Throw away any unused medicine after the expiration date.  2017 Elsevier/Gold Standard (2015-09-27 09:26:49) Heart Failure Heart failure means your heart has trouble pumping blood. This makes it hard for your body to work well. Heart failure is usually a long-term (chronic) condition. You must take good care of yourself and follow your doctor's treatment plan. HOME CARE  Take your heart medicine as told by your doctor.  Do not stop taking medicine unless your doctor tells you to.  Do not skip any dose of medicine.  Refill your medicines before they run out.  Take other medicines only as told by your doctor or pharmacist.  Stay active if told by your doctor. The elderly and people with severe heart failure should talk with a doctor about physical activity.  Eat heart-healthy foods. Choose  foods that are without trans fat and are low in saturated fat, cholesterol, and salt (sodium). This includes fresh or frozen fruits and vegetables, fish, lean meats, fat-free or low-fat dairy foods, whole grains, and high-fiber foods. Lentils and dried peas and beans (legumes) are also good choices.  Limit salt if told by your doctor.  Cook in a healthy way. Roast, grill, broil, bake, poach, steam, or stir-fry foods.  Limit fluids as told by your doctor.  Weigh yourself every morning. Do this after you pee (urinate) and before you eat breakfast. Write down your weight to give to your doctor.  Take your blood pressure and write it down if your doctor tells you to.  Ask your doctor how to check your pulse. Check your pulse as told.  Lose weight if told by your doctor.  Stop smoking or chewing tobacco. Do not use gum or patches that help you quit without your doctor's approval.  Schedule and go to doctor visits as told.  Nonpregnant women should have no more than 1 drink a day. Men should have no more than 2 drinks a day. Talk to your doctor about drinking alcohol.  Stop illegal drug use.  Stay current with shots (immunizations).  Manage your health conditions as told by your doctor.  Learn to manage your stress.  Rest when you are tired.  If it is really hot outside:  Avoid intense activities.  Use air conditioning or fans, or get in a cooler place.  Avoid caffeine and alcohol.  Wear loose-fitting, lightweight, and light-colored clothing.  If it is really cold outside:  Avoid intense activities.  Layer your clothing.  Wear mittens or gloves, a hat, and a scarf when going outside.  Avoid alcohol.  Learn about heart failure and get support as needed.  Get help to maintain or improve your quality of life and your ability to care for yourself as needed.  GET HELP IF:   You gain weight quickly.  You are more short of breath than usual.  You cannot do your normal  activities.  You tire easily.  You cough more than normal, especially with activity.  You have any or more puffiness (swelling) in areas such as your hands, feet, ankles, or belly (abdomen).  You cannot sleep because it is hard to breathe.  You feel like your heart is beating fast (palpitations).  You get dizzy or light-headed when you stand up. GET HELP RIGHT AWAY IF:   You have trouble breathing.  There is a change in mental status, such as becoming less alert or not being able to focus.  You have chest pain or discomfort.  You faint. MAKE SURE YOU:   Understand these instructions.  Will watch your condition.  Will get help right away if you are not doing well or get worse. This information is not intended to replace advice given to you by your health care provider. Make sure you discuss any questions you have with your health care provider. Document Released: 06/03/2008 Document Revised: 09/15/2014 Document Reviewed: 10/11/2012 Elsevier Interactive Patient Education  2017 [place] on my medicine - ELIQUIS (apixaban)  This medication education was reviewed with me or my healthcare representative as part of my discharge preparation.   Why was Eliquis prescribed for you? Eliquis was prescribed for you to reduce the risk of a blood clot forming that can cause a stroke if you have a medical condition called atrial fibrillation (a type of irregular heartbeat).  What do You need to know about Eliquis ? Take your Eliquis TWICE DAILY - one tablet in the morning and one tablet in the evening with or without food. If you have difficulty swallowing the tablet whole please discuss with your pharmacist how to take the medication safely.  Take Eliquis exactly as prescribed by your doctor and DO NOT stop taking Eliquis without talking to the doctor who prescribed the medication.  Stopping may increase your risk of developing a stroke.  Refill your prescription  before you run out.  After discharge, you should have regular check-up appointments with your healthcare provider that is prescribing your Eliquis.  In the future your dose may need to be changed if your kidney function or weight changes by a significant amount or as you get older.  What do you do if you miss a dose? If you miss a dose, take it as soon as you remember on the same day and resume taking twice daily.  Do not take more than one dose of ELIQUIS at the same time to make up a missed dose.  Important Safety Information A possible side effect of Eliquis is bleeding. You should call your healthcare provider right away if you experience any of the following: ? Bleeding from an injury or your nose that does not stop. ? Unusual colored urine (red or dark brown) or unusual colored stools (red or black). ? Unusual bruising for unknown reasons. ? A serious fall or if you hit your head (even if there is no bleeding).  Some medicines may interact with Eliquis and might increase your risk of bleeding or clotting while on Eliquis. To help avoid this, consult your healthcare provider or pharmacist prior to using any new prescription or non-prescription medications, including herbals, vitamins, non-steroidal anti-inflammatory drugs (NSAIDs) and supplements.  This website has more information on Eliquis (apixaban): http://[email or web address].com/eliquis/home

## 2016-08-14 NOTE — Progress Notes (Signed)
Patient Name: Shane Mathews Date of Encounter: 08/14/2016  Primary Cardiologist: new [facility]([place])  Hospital Problem List     Principal Problem:   Pleural effusion Active Problems:   Essential hypertension   Dyspnea   Atrial fibrillation (HCC)   Edema   Hyperglycemia   Hypokalemia   Absent pulse in lower extremity   Bradycardia     Subjective   Feeling much better.  Breathing has improved.  Lower extremity edema is improving.  He notes that his appetite has improved.   Inpatient Medications    Scheduled Meds: . amLODipine  10 mg Oral Daily  . apixaban  5 mg Oral BID  . aspirin EC  81 mg Oral Daily  . atorvastatin  40 mg Oral q1800  . carvedilol  6.25 mg Oral BID WC  . enalapril  20 mg Oral BID  . febuxostat  40 mg Oral Daily  . furosemide  40 mg Intravenous BID  . hydrALAZINE  100 mg Oral Q8H  . LORazepam  0.5 mg Oral BID  . pantoprazole  20 mg Oral Daily  . PARoxetine  20 mg Oral Daily  . spironolactone  50 mg Oral Daily  . terazosin  10 mg Oral QHS  . triamcinolone ointment   Topical BID   Continuous Infusions:  PRN Meds: benzonatate, guaiFENesin-dextromethorphan, LORazepam, traMADol   Vital Signs    Vitals:   08/13/16 2012 08/14/16 0500 08/14/16 1100 08/14/16 1242  BP: (!) 182/67 (!) 175/57 (!) 189/61 (!) 196/57  Pulse: 77 78  88  Resp: 18 18  18   Temp: 97.8 F (36.6 C) 97.9 F (36.6 C)  97.8 F (36.6 C)  TempSrc: Oral Oral  Oral  SpO2: 100% 97%  93%  Weight:  132.5 kg (292 lb 3.2 oz)    Height:        Intake/Output Summary (Last 24 hours) at 08/14/16 1329 Last data filed at 08/14/16 1242  Gross per 24 hour  Intake             2080 ml  Output             4425 ml  Net            -2345 ml   Filed Weights   08/12/16 1235 08/13/16 0358 08/14/16 0500  Weight: (!) 139.6 kg (307 lb 12.8 oz) (!) 137 kg (302 lb) 132.5 kg (292 lb 3.2 oz)    Physical Exam   GEN: Well nourished, well developed, in no acute distress.  HEENT: Grossly normal.    Neck: Supple, no JVD, carotid bruits, or masses. Cardiac: RRR, II/VI systolic murmurs at the LUSB and apex.  No rubs or gallops. No clubbing, cyanosis.  2+ pitting edema to the upper tibia bilaterally.  Radials/DP/PT 2+ and equal bilaterally.  Respiratory:  Respirations regular and unlabored, clear to auscultation bilaterally. GI: Soft, nontender, nondistended, BS + x 4. MS: no deformity or atrophy. Skin: warm and dry, no rash. Neuro:  Strength and sensation are intact. Psych: AAOx3.  Normal affect.  Labs    CBC  Recent Labs  08/12/16 1501 08/13/16 0339  WBC 8.8  8.8 7.8  HGB 11.0*  11.2* 10.5*  HCT 33.7*  34.3* 32.8*  MCV 73.6*  73.6* 74.4*  PLT 217  219 199   Basic Metabolic Panel  Recent Labs  08/13/16 0339 08/13/16 0756 08/14/16 0218  NA 134*  --  135  K 3.0*  --  3.1*  CL 95*  --  95*  CO2 31  --  29  GLUCOSE 110*  --  121*  BUN 7  --  6  CREATININE 0.96  --  0.86  CALCIUM 8.4*  --  8.6*  MG  --  2.1  --    Liver Function Tests  Recent Labs  08/12/16 1501  AST 16  ALT 14*  ALKPHOS 77  BILITOT 0.7  PROT 5.8*  ALBUMIN 3.1*   No results for input(s): LIPASE, AMYLASE in the last 72 hours. Cardiac Enzymes  Recent Labs  08/12/16 1501  TROPONINI <0.03   BNP Invalid input(s): POCBNP D-Dimer No results for input(s): DDIMER in the last 72 hours. Hemoglobin A1C  Recent Labs  08/12/16 0550  HGBA1C 6.4*   Fasting Lipid Panel  Recent Labs  08/12/16 1501  CHOL 99  HDL 36*  LDLCALC 36  TRIG 536137  CHOLHDL 2.8   Thyroid Function Tests  Recent Labs  08/14/16 0218  TSH 2.080    Telemetry    Atrial fibrillation.  Rates 60s-100 - Personally Reviewed  ECG   n/a  Radiology    No results found.  Cardiac Studies   Echo 08/13/16: Study Conclusions  - Left ventricle: The cavity size was normal. Wall thickness was   increased in a pattern of moderate LVH. Systolic function was   normal. The estimated ejection fraction was in  the range of 60%   to 65%. Wall motion was normal; there were no regional wall   motion abnormalities. - Mitral valve: There was mild regurgitation. - Left atrium: The atrium was severely dilated.  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  Right TBI of 0.46 and left TBI of 0.47 are suggestive of abnormal arterial flow at rest.   Patient Profile     Shane Mathews is a 83M with hypertension, hyperlipidemia, OSA and GERD here with newly diagnosed atrial fibrillation with slow ventricular response and acute diastolic heart failure.  Assessment & Plan    # Newly-diagnosed atrial fibrillation: Rates well-controlled.  He initially had slow ventricular response.  We will start carvedilol 6.25mg  bid.  Eliquis started this admission. Normal LVEF on echo.  Free T4 was mildly elevated and TSH was normal consistent with subclinical hyperthyroidism.  Unlikely this is contributing to his atrial fibrillation.    # Hypertensive heart disease:  BP is very poorly-controlled.  His home atenolol was held on admission due to bradycardia. We will start carvedilol 6.25mg  bid and monitor.  Increase hydralazine to 100mg  tid.  HCTZ has been held due to diuresis with lasix.  Continue amlodipine, enalapril, and spironolactone.     # Acute diastolic heart failure: Shane Mathews remains very volume overloaded.  Renal function is improving with lasix.  We will increase lasix to 60mg  bid, as he is very volume overloaded.    # Hyperlipidemia:  LDL 36.  Continue pravastatin.   # PAD: Shane Mathews had ABIs 12/6 that showed moderate disease bilaterally.  He is asymptomatic.  Continue pravastatin.  We will discontinue aspirin given that he is on Eliquis.    Signed, Chilton Siiffany Hewitt, MD  08/14/2016, 1:29 PM

## 2016-08-14 NOTE — Progress Notes (Signed)
Patient is refusing triamcinolone ointment (KENALOG) 0.1 % because he says his dermatologists wants him to wait to use it. He wants to take it home upon discharge.

## 2016-08-14 NOTE — Progress Notes (Signed)
PT Cancellation Note  Patient Details Name: Fayrene Fearingnthony H Wittmeyer MRN: [id number] DOB: [birth year]   Cancelled Treatment:    Reason Eval/Treat Not Completed: Patient declined, no reason specified. Pt refusing to participate in therapy evaluation today. PT encouraged pt and emphasized the importance of movement; however, pt continues to say "I will try it tomorrow". PT will continue to f/u with pt as appropriate.   Alessandra BevelsJennifer M Sully Manzi 08/14/2016, 2:13 PM

## 2016-08-14 NOTE — Evaluation (Signed)
Occupational Therapy Evaluation Patient Details Name: Shane Mathews MRN: [id number] DOB: 1940/10/01 Today's Date: 08/14/2016    History of Present Illness Pt was admitted with SOB/pleural effusion.  PMH:  AFib, HF, HTN, venous insufficiency   Clinical Impression   This 75 year old man was admitted for the above.  At baseline, he needed assistance for socks, but otherwise performed ADLs on his own.  He will benefit from continued OT to increase safety and independence. Goals in acute are for min guard to supervision level.      Follow Up Recommendations  Home health OT    Equipment Recommendations  3 in 1 bedside commode    Recommendations for Other Services       Precautions / Restrictions Precautions Precautions: Fall Precaution Comments: c/o knee pain Restrictions Weight Bearing Restrictions: No      Mobility Bed Mobility Overal bed mobility: Needs Assistance             General bed mobility comments: supervision, HOB raised  Transfers Overall transfer level: Needs assistance Equipment used: Rolling walker (2 wheeled) Transfers: Sit to/from [employer] CorporationStand;Stand Pivot Transfers Sit to Stand: Min assist Stand pivot transfers: Min assist       General transfer comment: steadying assistance    Balance                                            ADL Overall ADL's : Needs assistance/impaired     Grooming: Wash/dry hands;Set up;Sitting   Upper Body Bathing: Set up;Sitting   Lower Body Bathing: Moderate assistance;Sit to/from stand   Upper Body Dressing : Set up;Sitting   Lower Body Dressing: Maximal assistance;Sit to/from stand   Toilet Transfer: Minimal assistance;Stand-pivot;BSC;RW   Toileting- Clothing Manipulation and Hygiene: Minimal assistance;Sit to/from stand         General ADL Comments: [facility] then RW for back to bed. This did not signficantly improve pain.  OT was outside of room charting when  pt called to use commode, and he was agreeable to OT assessment then. Encouraged him to walk with PT tomorrow.  discussed 3:1 for over toilet and pt is agreeable to this. It would make toilet transfers easier for him     Vision     Perception     Praxis      Pertinent Vitals/Pain Pain Assessment: Faces Faces Pain Scale: Hurts little more Pain Location: knees Pain Descriptors / Indicators: Sore;Aching Pain Intervention(s): Limited activity within patient's tolerance;Monitored during session;Repositioned     Hand Dominance     Extremity/Trunk Assessment Upper Extremity Assessment Upper Extremity Assessment: Overall WFL for tasks assessed           Communication Communication Communication: No difficulties   Cognition Arousal/Alertness: Awake/alert Behavior During Therapy: WFL for tasks assessed/performed Overall Cognitive Status: Within Functional Limits for tasks assessed                     General Comments       Exercises       Shoulder Instructions      Home Living Family/patient expects to be discharged to:: Private residence Living Arrangements: Spouse/significant other                 Bathroom Shower/Tub: Producer, television/film/videoWalk-in shower   Bathroom Toilet: Standard     Home Equipment: Crutches;Walker -  2 wheels          Prior Functioning/Environment Level of Independence: Needs assistance        Comments: for socks        OT Problem List: Decreased strength;Decreased activity tolerance;Impaired balance (sitting and/or standing);Pain;Decreased knowledge of use of DME or AE   OT Treatment/Interventions: Self-care/ADL training;DME and/or AE instruction;Balance training;Patient/family education;Energy conservation    OT Goals(Current goals can be found in the care plan section) Acute Rehab OT Goals Patient Stated Goal: home OT Goal Formulation: With patient Time For Goal Achievement: 08/28/16 Potential to Achieve Goals: Good ADL Goals Pt  Will Perform Grooming: with supervision;standing Pt Will Transfer to Toilet: with supervision;ambulating;bedside commode Pt Will Perform Toileting - Clothing Manipulation and hygiene: with supervision;sit to/from stand Pt Will Perform Tub/Shower Transfer: Shower transfer;with min guard assist;ambulating;3 in 1 Additional ADL Goal #1: pt will initiate rest breaks for energy conservation and demonstrate pursed lip breathing when he feels SOB  OT Frequency: Min 2X/week   Barriers to D/C:            Co-evaluation              End of Session    Activity Tolerance: Patient tolerated treatment well Patient left: in bed;with call bell/phone within reach   Time: 1516-1535 OT Time Calculation (min): 19 min Charges:  OT General Charges $OT Visit: 1 Procedure OT Evaluation $OT Eval Moderate Complexity: 1 Procedure G-Codes:    Lindley Stachnik 08/14/2016, 3:57 PM Marica OtterMaryellen Brenn Deziel, OTR/L [phone] 08/14/2016

## 2016-08-15 DIAGNOSIS — I1A0 Resistant hypertension: Secondary | ICD-10-CM | POA: Diagnosis present

## 2016-08-15 DIAGNOSIS — I10 Essential (primary) hypertension: Secondary | ICD-10-CM | POA: Diagnosis present

## 2016-08-15 LAB — URINALYSIS, ROUTINE W REFLEX MICROSCOPIC
BILIRUBIN URINE: NEGATIVE
GLUCOSE, UA: NEGATIVE mg/dL
Hgb urine dipstick: NEGATIVE
KETONES UR: NEGATIVE mg/dL
LEUKOCYTES UA: NEGATIVE
NITRITE: NEGATIVE
PH: 8 (ref 5.0–8.0)
Protein, ur: 100 mg/dL — AB
Specific Gravity, Urine: 1.005 (ref 1.005–1.030)

## 2016-08-15 LAB — CBC
HCT: 36.2 % — ABNORMAL LOW (ref 39.0–52.0)
HEMOGLOBIN: 11.7 g/dL — AB (ref 13.0–17.0)
MCH: 23.8 pg — ABNORMAL LOW (ref 26.0–34.0)
MCHC: 32.3 g/dL (ref 30.0–36.0)
MCV: 73.7 fL — ABNORMAL LOW (ref 78.0–100.0)
Platelets: 215 10*3/uL (ref 150–400)
RBC: 4.91 MIL/uL (ref 4.22–5.81)
RDW: 15.4 % (ref 11.5–15.5)
WBC: 10.3 10*3/uL (ref 4.0–10.5)

## 2016-08-15 MED ORDER — TERAZOSIN HCL 5 MG PO CAPS
20.0000 mg | ORAL_CAPSULE | Freq: Every day | ORAL | Status: DC
  Administered 2016-08-15 – 2016-08-18 (×4): 20 mg via ORAL
  Filled 2016-08-15 (×4): qty 4

## 2016-08-15 MED ORDER — POTASSIUM CHLORIDE CRYS ER 20 MEQ PO TBCR
40.0000 meq | EXTENDED_RELEASE_TABLET | Freq: Two times a day (BID) | ORAL | Status: AC
  Administered 2016-08-15: 40 meq via ORAL
  Filled 2016-08-15: qty 2

## 2016-08-15 MED ORDER — CARVEDILOL 6.25 MG PO TABS: 6.2500 mg | ORAL_TABLET | Freq: Once | ORAL | Status: DC

## 2016-08-15 MED ORDER — CARVEDILOL 12.5 MG PO TABS
12.5000 mg | ORAL_TABLET | Freq: Two times a day (BID) | ORAL | Status: DC
  Administered 2016-08-15 – 2016-08-17 (×5): 12.5 mg via ORAL
  Filled 2016-08-15 (×5): qty 1

## 2016-08-15 NOTE — Progress Notes (Signed)
PT Cancellation Note  Patient Details Name: Shane Mathews MRN: [id number] DOB: 09-21-1940   Cancelled Treatment:    Reason Eval/Treat Not Completed: Patient declined, no reason specified. Pt refusing participation in PT evaluation for three days in a row. PT reminded pt that yesterday he was agreeable to therapy session today; however, pt stated "I'm going down for a test and I want to save my energy for that". When therapist tried to encourage pt to sit EOB pt stated "now don't argue with me, I don't want to do it right now". PT will continue to f/u with pt as appropriate and available.   Alessandra BevelsJennifer M Uva Runkel 08/15/2016, 3:32 PM Deborah ChalkJennifer Dauna Ziska, PT, DPT [phone]

## 2016-08-15 NOTE — Progress Notes (Signed)
Patient Name: Shane Mathews Date of Encounter: 08/15/2016  Primary Cardiologist: new [facility]  Hospital Problem List     Principal Problem:   Pleural effusion Active Problems:   Dyspnea   Atrial fibrillation (HCC)   Edema   Hyperglycemia   Hypokalemia   Absent pulse in lower extremity   Bradycardia   Hypertensive heart disease   Acute on chronic diastolic heart failure [facility](HCC)    Patient Profile     Shane Mathews is a 56M with hypertension, hyperlipidemia, OSA and GERD here with newly diagnosed atrial fibrillation with slow ventricular response and acute diastolic heart failure.  Subjective   Feeling much better.  Breathing has improved.  Lower extremity edema is improving.  He notes that his appetite has improved. He is out of bed and sitting in chair, enjoying the snow from his window.   Inpatient Medications    Scheduled Meds: . amLODipine  10 mg Oral Daily  . apixaban  5 mg Oral BID  . atorvastatin  40 mg Oral q1800  . carvedilol  6.25 mg Oral BID WC  . enalapril  20 mg Oral BID  . febuxostat  40 mg Oral Daily  . furosemide  60 mg Intravenous BID  . hydrALAZINE  100 mg Oral Q8H  . LORazepam  0.5 mg Oral BID  . pantoprazole  20 mg Oral Daily  . PARoxetine  20 mg Oral Daily  . spironolactone  50 mg Oral Daily  . terazosin  10 mg Oral QHS  . triamcinolone ointment   Topical BID   Continuous Infusions:  PRN Meds: benzonatate, guaiFENesin-dextromethorphan, LORazepam, traMADol   Vital Signs    Vitals:   08/14/16 1100 08/14/16 1242 08/14/16 2011 08/15/16 0531  BP: (!) 189/61 (!) 196/57 (!) 185/65 (!) 194/66  Pulse:  88 87 79  Resp:  18 18 18   Temp:  97.8 F (36.6 C) 97.7 F (36.5 C) 98.6 F (37 C)  TempSrc:  Oral Oral Oral  SpO2:  93% 96% 95%  Weight:    284 lb 9.6 oz (129.1 kg)  Height:        Intake/Output Summary (Last 24 hours) at 08/15/16 1121 Last data filed at 08/15/16 1100  Gross per 24 hour  Intake              721 ml  Output              3200 ml  Net            -2479 ml   Filed Weights   08/13/16 0358 08/14/16 0500 08/15/16 0531  Weight: (!) 302 lb (137 kg) 292 lb 3.2 oz (132.5 kg) 284 lb 9.6 oz (129.1 kg)    Physical Exam   GEN: Well nourished, well developed, in no acute distress.  HEENT: Grossly normal.  Neck: Supple, no JVD, carotid bruits, or masses. Cardiac: irregularly irregular rhythm, regular rate, II/VI systolic murmurs at the LUSB and apex.  No rubs or gallops. No clubbing, cyanosis.  2+ pitting edema to the upper tibia bilaterally.  Radials/DP/PT 2+ and equal bilaterally.  Respiratory:  Respirations regular and unlabored, decreased BS bilaterally GI: Soft, nontender, nondistended, BS + x 4. MS: no deformity or atrophy. Skin: warm and dry, no rash. Neuro:  Strength and sensation are intact. Psych: AAOx3.  Normal affect.  Labs    CBC  Recent Labs  08/13/16 0339 08/15/16 0517  WBC 7.8 10.3  HGB 10.5* 11.7*  HCT 32.8* 36.2*  MCV  74.4* 73.7*  PLT 199 215   Basic Metabolic Panel  Recent Labs  08/13/16 0756 08/14/16 0218 08/14/16 1608  NA  --  135 132*  K  --  3.1* 3.3*  CL  --  95* 93*  CO2  --  29 29  GLUCOSE  --  121* 125*  BUN  --  6 9  CREATININE  --  0.86 1.05  CALCIUM  --  8.6* 8.2*  MG 2.1  --   --    Liver Function Tests  Recent Labs  08/12/16 1501  AST 16  ALT 14*  ALKPHOS 77  BILITOT 0.7  PROT 5.8*  ALBUMIN 3.1*   No results for input(s): LIPASE, AMYLASE in the last 72 hours. Cardiac Enzymes  Recent Labs  08/12/16 1501  TROPONINI <0.03   BNP Invalid input(s): POCBNP D-Dimer No results for input(s): DDIMER in the last 72 hours. Hemoglobin A1C No results for input(s): HGBA1C in the last 72 hours. Fasting Lipid Panel  Recent Labs  08/12/16 1501  CHOL 99  HDL 36*  LDLCALC 36  TRIG 161  CHOLHDL 2.8   Thyroid Function Tests  Recent Labs  08/14/16 0218  TSH 2.080    Telemetry    Atrial fibrillation.  Rates 60s-100 - Personally  Reviewed  ECG   n/a  Radiology    No results found.  Cardiac Studies   Echo 08/13/16: Study Conclusions  - Left ventricle: The cavity size was normal. Wall thickness was   increased in a pattern of moderate LVH. Systolic function was   normal. The estimated ejection fraction was in the range of 60%   to 65%. Wall motion was normal; there were no regional wall   motion abnormalities. - Mitral valve: There was mild regurgitation. - Left atrium: The atrium was severely dilated.  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  Right TBI of 0.46 and left TBI of 0.47 are suggestive of abnormal arterial flow at rest.   Patient Profile     Shane Mathews is a 50M with hypertension, hyperlipidemia, OSA and GERD here with newly diagnosed atrial fibrillation with slow ventricular response and acute diastolic heart failure.  Assessment & Plan    1. Newly-diagnosed atrial fibrillation: Rates well-controlled.  He initially had slow ventricular response.  Carvedilol 6.25mg  bid was added yesterday and he is tolerating well.  Eliquis started this admission. Normal LVEF on echo.  Free T4 was mildly elevated and TSH was normal consistent with subclinical hyperthyroidism.  Unlikely this is contributing to his atrial fibrillation.    2. Hypertensive heart disease:  Remains poorly-controlled. BP earlier today was 194/66.  His home atenolol was held on admission due to bradycardia. He was changed to carvedilol 6.25mg  bid yesterday. HR has remained stable, in terms of bradycardia. Hydralazine was also increased to 100mg  tid.  He is also on lasix, amlodipine, enalapril, and spironolactone. He may need further titration of enalapril to 40 mg daily. Hopefully his BP will continue to improve with diuresis.    3. Acute diastolic heart failure: breathing significantly improved, however Shane Mathews remains very volume overloaded. 2+ bilateral  pitting + weeping edema. Renal function has been stable. Lasix was increased to 60 mg bid yesterday. No BMP ordered this morning. Will order to assess renal function and K. He was hypokalemic yesterday with K of 3.3. Will replete K if necessary.   4. Hyperlipidemia:  LDL 36.  Continue pravastatin.   5. PAD: Shane Mathews had ABIs 12/6 that showed moderate disease bilaterally.  He is asymptomatic.  Continue pravastatin.  Aspirin has been discontinued given that he is on Eliquis.    Signed, Robbie LisBrittainy Simmons, PA-C  08/15/2016, 11:21 AM

## 2016-08-15 NOTE — Progress Notes (Signed)
Pt in progress of 24 hour urine collect started at 1300. Pt at 1815 states he got water in his orange collection urinal. [facility] and restart. New orange urinal provided from lab and restart began at 1830.  Jvion Turgeon Elige RadonBradley

## 2016-08-15 NOTE — Progress Notes (Signed)
Family Medicine Teaching Service Daily Progress Note Intern Pager: [phone]  [facility] number: [id number] Date of birth: [birth year] Age: 75 y.o. Gender: male  Primary Care Provider: Sanjuana LettersHENSEL,WILLIAM ARTHUR, MD Consultants: None Code Status: Full  Pt Overview and Major Events to Date:  1. Admit to FMTS 08/12/16  Assessment and Plan: Shane Mathews is a 75 y.o. male presenting with exertional SOB . PMH is significant for HTN, chronic venous insufficiency, Anxiety, GERD, Claudication, Hyperlipidemia  Dyspnea on Exertion, improving:   Presentation is concerning for new onset heart failure given history of orthopnea and BNP of 498.9 and finding of vascular congestion on chest x-ray, pitting edema in lower extremities and bibasilar crackles. Seen by cardiology increased lasix to 60mg  IV BID IV and has 2.85 L UOP past 24hrs.  Weight down 8 pound past 24 hours, total 23 pounds.  Continues faint bibasilar crackles on exam.  Echo showed normal systolic function with normal EF.  - Strict I's and O's - Daily weights - Continue 60 mg IV Lasix BID  New onset afb:  Noted on EKG.  Patient continues to endorses dyspnea with exertion.  Denies palpitations or CP.  No history of syncope or CVA/TIA.  Did not appreciate abnormal rhythm on exam this morning.  Continues to be bradycardic and currently holding BB.  Added apixaban 5mg  BID and plan to continue to diurese patient and if no improvement in symptoms proceed with TEE/DCCV.  - cardiology consulted; appreciate recommendations - apixaban 5mg  BID - continue to monitor SOB - consider TEE/DCCV  Hypertension: On six BP meds at home.  Holding HCTZ due to recent history of gout and holding BB due to bradycardia. Cardiology increased hydralazine to 100mg  TID and started coreg 6.25 mg BID.  Also increased spironolactone 50mg  yesterday.  BP this morning 194/66.  Seen by cardiology PA who increased enalapril to 40mg  BID.  Working up  for 2nd hypertension with CTA, 24 hr VMA and cortistol - spironolactone to 50mg  daily - continue norvasc 10mg  daily, enalapril 20mg  BID and hydralazine 100mg  TID - cont coreg 6.25mg  BID - CTA abd/pelvis workup 2nd HTN - f/u 24hr cortisol and VMA  Cough, stable: Productive cough clear sputum for past 6 mo months.  Former smoker quit over 20 years ago.  No sick contacts and denies any chest pain, fevers or upper respiratory symptoms.  Does have history of GERD and takes Pepcid.  Also has a history of childhood asthma with unknown PFT results.  Does not use any inhalers. Chest x-ray showed bilateral pleural effusions and vascular congestion and lungs were actually hypoinflated.   - Continue to monitor - Tessalon Perles 100mg  TID PRN - Consider albuterol PRN and PFTs  Anemia, stable: Hemoglobin of 11.2 noted on admission. MCV 73. Due for colonoscopy. FOBT negative in 08/2015.  - Monitor on CBC - Consider Iron studies if no improvement  History of gout, stable:  -changed to febuxostat 40mg  daily  Hypokalemia, improving: 2.8 upon presentation. Up to 3.3 this AM.  - Repleted with 40kdur BID (1 dose) - f/u BMP - continue spironolactone 50mg  daily  Panic Attacks, stable: Last documentation and office visit was in 2014. Currently only on Paxil 20 mg daily.   - Continue Paxil 20 mg daily - add ativan 0.5mg  BID q4hrs PRN  Hypertriglyceridemia: DC's prevastatin and started 40mg  lipitor.  Lipid panel showed LDL 36, otherwise normal. - Continue lipitor 40mg  daily  GERD: Reportedly takes pepcid - Protonix 20mg  daily  Intermittent Claudication:  Reported claudication in bilateral lower extremities in the past. ABIs show moderate arterial disease.  Patient denies current symptoms.  - continue prevastatin 40mg  daily.  - ABI: R: 0.71, L: 0.69.  - Consider TED hose - consider vascular f/u outpatient  FEN/GI: Heart healthy diet/ protonix Prophylaxis: subQ heparin  Disposition: discharge  home when stable  Subjective:  Patient feels well this morning and denies CP or palpitations.  Feels improved with diuresis  Objective: Temp:  [97.7 F (36.5 C)-98.6 F (37 C)] 98.6 F (37 C) (12/08 0531) Pulse Rate:  [79-88] 79 (12/08 0531) Resp:  [18] 18 (12/08 0531) BP: (185-196)/(57-66) 194/66 (12/08 0531) SpO2:  [93 %-96 %] 95 % (12/08 0531) Weight:  [284 lb 9.6 oz (129.1 kg)] 284 lb 9.6 oz (129.1 kg) (12/08 0531) Physical Exam: General: 75 year old male sitting up in bed appearing comfortable ENTM: MMM Cardiovascular: Bradycardic, regular rhythm no murmurs rubs or gallops heard Respiratory: NWOB, faint bibasilar crackles, no wheezing or rhonci Gastrointestinal: soft, NTND MSK: No deformities, moves all extremities, no cyanosis or clubbing Extremities: 2+ pitting edema in lower extremities up to the knees bilaterally Derm: Venous stasis changes seen on bilateral lower extremities  Neuro: AAOx3 Psych: Normal mood and affect  Laboratory:  Recent Labs Lab 08/12/16 1501 08/13/16 0339 08/15/16 0517  WBC 8.8  8.8 7.8 10.3  HGB 11.0*  11.2* 10.5* 11.7*  HCT 33.7*  34.3* 32.8* 36.2*  PLT 217  219 199 215    Recent Labs Lab 08/12/16 1501 08/13/16 0339 08/14/16 0218 08/14/16 1608  NA 134* 134* 135 132*  K 2.8* 3.0* 3.1* 3.3*  CL 96* 95* 95* 93*  CO2 33* 31 29 29   BUN 10 7 6 9   CREATININE 0.94  0.91 0.96 0.86 1.05  CALCIUM 8.3* 8.4* 8.6* 8.2*  PROT 5.8*  --   --   --   BILITOT 0.7  --   --   --   ALKPHOS 77  --   --   --   ALT 14*  --   --   --   AST 16  --   --   --   GLUCOSE 148* 110* 121* 125*    Imaging/Diagnostic Tests: No results found.  Renne Muscaaniel L Cherish Runde, MD 08/15/2016, 8:17 AM PGY-1, [place] FPTS Intern pager: [phone], text pages welcome

## 2016-08-15 NOTE — Care Management Important Message (Signed)
Important Message  Patient Details  Name: Shane Mathews MRN: [id number] Date of Birth: 08-24-1941   Medicare Important Message Given:  Yes    Airyonna Franklyn 08/15/2016, 12:21 PM

## 2016-08-16 ENCOUNTER — Inpatient Hospital Stay (HOSPITAL_COMMUNITY): Payer: Medicare Other

## 2016-08-16 DIAGNOSIS — I160 Hypertensive urgency: Secondary | ICD-10-CM

## 2016-08-16 LAB — BASIC METABOLIC PANEL
Anion gap: 11 (ref 5–15)
BUN: 12 mg/dL (ref 6–20)
CALCIUM: 8.3 mg/dL — AB (ref 8.9–10.3)
CHLORIDE: 93 mmol/L — AB (ref 101–111)
CO2: 30 mmol/L (ref 22–32)
CREATININE: 0.98 mg/dL (ref 0.61–1.24)
GFR calc non Af Amer: >60 mL/min (ref >60)
Glucose, Bld: 141 mg/dL — ABNORMAL HIGH (ref 65–99)
Potassium: 3.2 mmol/L — ABNORMAL LOW (ref 3.5–5.1)
Sodium: 134 mmol/L — ABNORMAL LOW (ref 135–145)

## 2016-08-16 MED ORDER — POTASSIUM CHLORIDE CRYS ER 20 MEQ PO TBCR
40.0000 meq | EXTENDED_RELEASE_TABLET | Freq: Two times a day (BID) | ORAL | Status: AC
  Administered 2016-08-16 (×2): 40 meq via ORAL
  Filled 2016-08-16 (×2): qty 2

## 2016-08-16 NOTE — Progress Notes (Signed)
Family Medicine Teaching Service Daily Progress Note Intern Pager: [phone]  [facility] number: [id number] Date of birth: 10-12-1940 Age: 75 y.o. Gender: male  Primary Care Provider: Sanjuana LettersHENSEL,WILLIAM ARTHUR, MD Consultants: None Code Status: Full  Pt Overview and Major Events to Date:  1. Admit to FMTS 08/12/16  Assessment and Plan: Shane Mathews is a 75 y.o. male presenting with exertional SOB . PMH is significant for HTN, chronic venous insufficiency, Anxiety, GERD, Claudication, Hyperlipidemia  Dyspnea on Exertion, improving:   Presentation is concerning for new onset heart failure given history of orthopnea and BNP of 498.9 and finding of vascular congestion on chest x-ray, pitting edema in lower extremities and bibasilar crackles. Seen by cardiology increased lasix to 60mg  IV BID IV and has 1.5L UOP past 24hrs.  Weight down 1 pound past 24 hours, total 24 pounds.  Continues faint bibasilar crackles on exam.  Echo showed normal systolic function with normal EF.  - Strict I's and O's - Daily weights - Continue 60 mg IV Lasix BID  New onset afb:  Noted on EKG.  Patient continues to endorses dyspnea with exertion.  Denies palpitations or CP.  No history of syncope or CVA/TIA.  Did not appreciate abnormal rhythm on exam this morning.  Continues to be bradycardic and currently holding BB.  Added apixaban 5mg  BID and plan to continue to diurese patient and if no improvement in symptoms proceed with TEE/DCCV.  - cardiology consulted; appreciate recommendations - apixaban 5mg  BID - continue to monitor SOB - consider TEE/DCCV  Hypertension: On six BP meds at home.  Holding HCTZ due to recent history of gout and holding BB due to bradycardia. Cardiology increased hydralazine to 100mg  TID and started coreg 12 mg BID.  Also increased spironolactone 50mg  yesterday and terazosin 20mg .  BP this morning 166/59.  Enapralil also increased to 40mg  BID. Working up for  2nd hypertension with renal artery US that was limited due to body habitus and gas.  24hr urine metanepherines and cortisol pending.  - spironolactone to 50mg  daily - continue norvasc 10mg  daily, enalapril 40mg  BID and hydralazine 100mg  TID, terazosin 20mg  daily - cont coreg 12mg  BID - reconsider CTA abd - f/u 24hr cortisol and VMA  Cough, stable: Productive cough clear sputum for past 6 mo months.  Former smoker quit over 20 years ago.  No sick contacts and denies any chest pain, fevers or upper respiratory symptoms.  Does have history of GERD and takes Pepcid.  Also has a history of childhood asthma with unknown PFT results.  Does not use any inhalers. Chest x-ray showed bilateral pleural effusions and vascular congestion and lungs were actually hypoinflated.   - Continue to monitor - Tessalon Perles 100mg  TID PRN - Consider albuterol PRN and PFTs  Anemia, stable: Hemoglobin of 11.2 noted on admission. MCV 73. Due for colonoscopy. FOBT negative in 08/2015.  - Monitor on CBC - Consider Iron studies if no improvement  History of gout, stable:  -changed to febuxostat 40mg  daily  Hypokalemia, improving: 2.8 upon presentation. Up to 3.3 this AM.  - Repleted with 40kdur BID (1 dose) - f/u BMP - continue spironolactone 50mg  daily  Panic Attacks, stable: Last documentation and office visit was in 2014. Currently only on Paxil 20 mg daily.   - Continue Paxil 20 mg daily - add ativan 0.5mg  BID q4hrs PRN  Hypertriglyceridemia: DC's prevastatin and started 40mg  lipitor.  Lipid panel showed LDL 36, otherwise normal. - Continue lipitor  40mg  daily  GERD: Reportedly takes pepcid - Protonix 20mg  daily  Intermittent Claudication:  Reported claudication in bilateral lower extremities in the past. ABIs show moderate arterial disease.  Patient denies current symptoms.  - continue prevastatin 40mg  daily.  - ABI: R: 0.71, L: 0.69.  - Consider TED hose - consider vascular f/u  outpatient  FEN/GI: Heart healthy diet/ protonix Prophylaxis: subQ heparin  Disposition: discharge home when stable  Subjective:  Patient feels well this morning and denies CP or palpitations.  Feels improved with diuresis  Objective: Temp:  [97.8 F (36.6 C)-98 F (36.7 C)] 98 F (36.7 C) (12/09 0556) Pulse Rate:  [68-80] 80 (12/09 0556) Resp:  [18] 18 (12/09 0556) BP: (166-188)/(56-69) 166/59 (12/09 0556) SpO2:  [96 %-98 %] 98 % (12/09 0556) Weight:  [283 lb 6.4 oz (128.5 kg)] 283 lb 6.4 oz (128.5 kg) (12/09 0556) Physical Exam: General: 75 year old male sitting up in bed appearing comfortable ENTM: MMM Cardiovascular: Bradycardic, regular rhythm no murmurs rubs or gallops heard Respiratory: NWOB, faint bibasilar crackles, no wheezing or rhonci, improved air movement Gastrointestinal: soft, NTND MSK: No deformities, moves all extremities, no cyanosis or clubbing Extremities: 2+ pitting edema in lower extremities up to the knees bilaterally.  Legs look significantly improved from admission Derm: Venous stasis changes seen on bilateral lower extremities  Neuro: AAOx3 Psych: Normal mood and affect  Laboratory:  Recent Labs Lab 08/12/16 1501 08/13/16 0339 08/15/16 0517  WBC 8.8  8.8 7.8 10.3  HGB 11.0*  11.2* 10.5* 11.7*  HCT 33.7*  34.3* 32.8* 36.2*  PLT 217  219 199 215    Recent Labs Lab 08/12/16 1501  08/14/16 0218 08/14/16 1608 08/16/16 0144  NA 134*  < > 135 132* 134*  K 2.8*  < > 3.1* 3.3* 3.2*  CL 96*  < > 95* 93* 93*  CO2 33*  < > 29 29 30   BUN 10  < > 6 9 12   CREATININE 0.94  0.91  < > 0.86 1.05 0.98  CALCIUM 8.3*  < > 8.6* 8.2* 8.3*  PROT 5.8*  --   --   --   --   BILITOT 0.7  --   --   --   --   ALKPHOS 77  --   --   --   --   ALT 14*  --   --   --   --   AST 16  --   --   --   --   GLUCOSE 148*  < > 121* 125* 141*  < > = values in this interval not displayed.  Imaging/Diagnostic Tests: No results found.  Shane Muscaaniel L Jacoba Cherney,  MD 08/16/2016, 10:07 AM PGY-1, [place] [facility] FPTS Intern pager: [phone], text pages welcome

## 2016-08-16 NOTE — Progress Notes (Signed)
**  Preliminary report by tech**  Renal artery duplex. Non-diagnostic study due to patient body habitus, bowel gas, and patient positioning. The patient was unable to tolerate lying flat for the study. He was also unable to lie on either of his sides to create a sufficient window to view the kidneys. Unable to visualize the aorta at midline due to overlying bowel gas.   08/16/16 8:39 AM Olen CordialGreg Patrina Andreas RVT

## 2016-08-16 NOTE — Evaluation (Signed)
Physical Therapy Evaluation Patient Details Name: Shane Mathews MRN: [id number] DOB: 10-06-1940 Today's Date: 08/16/2016   History of Present Illness  Pt is a 75 y/o male admitted with acute DOE/SOB/pleural effusion.  PMH:  AFib, HF, HTN, venous insufficiency  Clinical Impression  Pt presented supine in bed with HOB elevated, awake and willing to participate in therapy session. PT evaluation very limited secondary to pt's refusal to participate in further assessment secondary to reports of L knee pain. Pt only agreeable to sit EOB. Pt demonstrated good sitting balance with supervision for safety. He tolerated sitting EOB for approximately 10 minutes. Based on pt's performance during evaluation, PT currently recommending pt d/c to SNF. Pt would continue to benefit from skilled physical therapy services at this time while admitted and after d/c to address his below listed limitations in order to improve his overall safety and independence with functional mobility     Follow Up Recommendations SNF;Supervision/Assistance - 24 hour (if pt refuses, will need HHPT)    Equipment Recommendations  None recommended by PT    Recommendations for Other Services       Precautions / Restrictions Precautions Precautions: Fall Precaution Comments: c/o L knee pain Restrictions Weight Bearing Restrictions: No      Mobility  Bed Mobility Overal bed mobility: Needs Assistance Bed Mobility: Supine to Sit;Sit to Supine     Supine to sit: HOB elevated;Supervision Sit to supine: Supervision   General bed mobility comments: pt required increased time, HOB very elevated, supervision for safety  Transfers                 General transfer comment: pt refusing to perform any transfers or stand at this time secondary to reports of pain in L knee across the joint line. pt became very agitated when therapist encouraged mobility.  Ambulation/Gait                Stairs             Wheelchair Mobility    Modified Rankin (Stroke Patients Only)       Balance Overall balance assessment: Needs assistance Sitting-balance support: Feet supported;No upper extremity supported Sitting balance-Leahy Scale: Good                                       Pertinent Vitals/Pain Pain Assessment: Faces Faces Pain Scale: Hurts little more Pain Location: L knee Pain Descriptors / Indicators: Sore;Aching Pain Intervention(s): Monitored during session;Repositioned    Home Living Family/patient expects to be discharged to:: Private residence Living Arrangements: Spouse/significant other Available Help at Discharge: Family;Available 24 hours/day   Home Access: Stairs to enter Entrance Stairs-Rails: Right;Left;Can reach both Entrance Stairs-Number of Steps: 5 Home Layout: One level Home Equipment: Crutches;Walker - 2 wheels      Prior Function Level of Independence: Needs assistance   Gait / Transfers Assistance Needed: occasionally ambulates with use of RW  ADL's / Homemaking Assistance Needed: Needs assistance with donning socks        Hand Dominance        Extremity/Trunk Assessment   Upper Extremity Assessment: Overall WFL for tasks assessed           Lower Extremity Assessment: Generalized weakness;RLE deficits/detail;LLE deficits/detail RLE Deficits / Details: MMT revealed 4/5 for hip flexion, knee flexion, knee extension and ankle DF; hip abduction and adduction 3+/5 LLE Deficits / Details: MMT revealed 4/5  for ankle DF; deferred additional MMT secondary to pt reports of L knee pain and agitation when therapist attempted further assessment  Cervical / Trunk Assessment: Normal  Communication   Communication: No difficulties  Cognition Arousal/Alertness: Awake/alert Behavior During Therapy: WFL for tasks assessed/performed Overall Cognitive Status: Within Functional Limits for tasks assessed                      General  Comments      Exercises     Assessment/Plan    PT Assessment Patient needs continued PT services  PT Problem List Decreased strength;Decreased range of motion;Decreased activity tolerance;Decreased balance;Decreased mobility;Decreased coordination;Decreased knowledge of use of DME;Decreased safety awareness;Pain          PT Treatment Interventions DME instruction;Gait training;Stair training;Therapeutic exercise;Functional mobility training;Therapeutic activities;Balance training;Neuromuscular re-education;Patient/family education    PT Goals (Current goals can be found in the Care Plan section)  Acute Rehab PT Goals Patient Stated Goal: return home PT Goal Formulation: With patient Time For Goal Achievement: 08/30/16 Potential to Achieve Goals: Fair    Frequency Min 3X/week   Barriers to discharge        Co-evaluation               End of Session   Activity Tolerance: Patient limited by pain;Treatment limited secondary to agitation Patient left: in bed;with call bell/phone within reach Nurse Communication: Mobility status         Time: [id number] PT Time Calculation (min) (ACUTE ONLY): 18 min   Charges:   PT Evaluation $PT Eval Moderate Complexity: 1 Procedure     PT G CodesAlessandra Bevels:        Lariyah Shetterly M Menelik Mcfarren 08/16/2016, 3:14 PM Deborah ChalkJennifer Shakora Nordquist, PT, DPT [phone]

## 2016-08-16 NOTE — Progress Notes (Signed)
Patient Name: Shane Mathews Date of Encounter: 08/16/2016  Primary Cardiologist:  New (Dr. Duke Salviaandolph)  [facility]     Principal Problem:   Pleural effusion Active Problems:   Dyspnea   Atrial fibrillation (HCC)   Edema   Hyperglycemia   Hypokalemia   Absent pulse in lower extremity   Bradycardia   Hypertensive heart disease   Acute on chronic diastolic heart failure (HCC)   Resistant hypertension     Subjective   Breathing better.  He has not been mobile because of left knee pain.   Inpatient Medications    Scheduled Meds: . amLODipine  10 mg Oral Daily  . apixaban  5 mg Oral BID  . atorvastatin  40 mg Oral q1800  . carvedilol  12.5 mg Oral BID WC  . enalapril  20 mg Oral BID  . febuxostat  40 mg Oral Daily  . furosemide  60 mg Intravenous BID  . hydrALAZINE  100 mg Oral Q8H  . LORazepam  0.5 mg Oral BID  . pantoprazole  20 mg Oral Daily  . PARoxetine  20 mg Oral Daily  . potassium chloride  40 mEq Oral BID  . spironolactone  50 mg Oral Daily  . terazosin  20 mg Oral QHS  . triamcinolone ointment   Topical BID   Continuous Infusions:  PRN Meds: benzonatate, guaiFENesin-dextromethorphan, LORazepam, traMADol   Vital Signs    Vitals:   08/15/16 1704 08/15/16 1705 08/15/16 2127 08/16/16 0556  BP: (!) 188/56 (!) 179/61 (!) 171/69 (!) 166/59  Pulse: 78  68 80  Resp: 18  18 18   Temp: 97.9 F (36.6 C)  97.8 F (36.6 C) 98 F (36.7 C)  TempSrc: Oral  Oral Oral  SpO2: 96%  97% 98%  Weight:    283 lb 6.4 oz (128.5 kg)  Height:        Intake/Output Summary (Last 24 hours) at 08/16/16 1113 Last data filed at 08/16/16 0557  Gross per 24 hour  Intake              960 ml  Output              951 ml  Net                9 ml   Filed Weights   08/14/16 0500 08/15/16 0531 08/16/16 0556  Weight: 292 lb 3.2 oz (132.5 kg) 284 lb 9.6 oz (129.1 kg) 283 lb 6.4 oz (128.5 kg)    Physical Exam    GEN: NAD.  Neck:  Positive JVD 8 cm Cardiac:  Irregular Rate and Rhythm, murmurs, rubs, or gallops.  Mild edema.  Radials/DP/PT 2+  and equal bilaterally.  Respiratory:  Respirations  regular and unlabored, clear to auscultation bilaterally. GI: Soft, nontender, nondistended, BS + x 4. Skin: warm and dry, no rash. Neuro:   Strength and sensation are intact. Psych:  AAOx3.  Normal affect.  Labs    CBC  Recent Labs  08/15/16 0517  WBC 10.3  HGB 11.7*  HCT 36.2*  MCV 73.7*  PLT 215   Basic Metabolic Panel  Recent Labs  08/14/16 1608 08/16/16 0144  NA 132* 134*  K 3.3* 3.2*  CL 93* 93*  CO2 29 30  GLUCOSE 125* 141*  BUN 9 12  CREATININE 1.05 0.98  CALCIUM 8.2* 8.3*   Liver Function Tests No results for input(s): AST, ALT, ALKPHOS, BILITOT, PROT, ALBUMIN in the last 72 hours. No  results for input(s): LIPASE, AMYLASE in the last 72 hours. Cardiac Enzymes No results for input(s): CKTOTAL, CKMB, CKMBINDEX, TROPONINI in the last 72 hours. BNP Invalid input(s): POCBNP D-Dimer No results for input(s): DDIMER in the last 72 hours. Hemoglobin A1C No results for input(s): HGBA1C in the last 72 hours. Fasting Lipid Panel No results for input(s): CHOL, HDL, LDLCALC, TRIG, CHOLHDL, LDLDIRECT in the last 72 hours. Thyroid Function Tests  Recent Labs  08/14/16 0218  TSH 2.080    Telemetry    Atrial fib, rate well controlled. - Personally Reviewed  ECG    NA - Personally Reviewed  Radiology    No results found.  Cardiac Studies   Echo 08/13/16: Study Conclusions  - Left ventricle: The cavity size was normal. Wall thickness was increased in a pattern of moderate LVH. Systolic function was normal. The estimated ejection fraction was in the range of 60% to 65%. Wall motion was normal; there were no regional wall motion abnormalities. - Mitral valve: There was mild regurgitation. - Left atrium: The atrium was severely dilated.  ABI 08/13/16: Right ABI of 0.71 and left ABI of 0.69 are suggestive  of moderate arterial occlusive disease at rest.  Right TBI of 0.46 and left TBI of 0.47 are suggestive of abnormal arterial flow at rest.  Patient Profile     Shane Mathews is a 17M with hypertension, hyperlipidemia, OSA and GERD here with newly diagnosed atrial fibrillation with slow ventricular response and acute diastolic heart failure.  Assessment & Plan    ATRIAL FIB:  On Eliquis.    Shane Mathews has a CHA2DS2 - VASc score of 4 with a risk of stroke of 4%. Rate OK.  No change in therapy.  HYPERTENSIVE HEART DISEASE:  BP is still mildly elevated.  Just had meds adjusted so I am not suggesting a change today.   ACUTE ON CHRONIC DIASTOLIC HF:   Minus eight liters since admission.   Weight down 20 lbs.  I would continue IV diuresis today.  We talked about low salt.  Dietary consult would be helpful.   HYPOKALEMIA:  Due to get 40 meq of potassium today. Follow BMET.     Signed, Rollene RotundaJames Zyere Jiminez, MD  08/16/2016, 11:13 AM

## 2016-08-17 LAB — BASIC METABOLIC PANEL
Anion gap: 11 (ref 5–15)
BUN: 18 mg/dL (ref 6–20)
CHLORIDE: 92 mmol/L — AB (ref 101–111)
CO2: 29 mmol/L (ref 22–32)
CREATININE: 1.1 mg/dL (ref 0.61–1.24)
Calcium: 8.2 mg/dL — ABNORMAL LOW (ref 8.9–10.3)
Glucose, Bld: 115 mg/dL — ABNORMAL HIGH (ref 65–99)
POTASSIUM: 3.5 mmol/L (ref 3.5–5.1)
SODIUM: 132 mmol/L — AB (ref 135–145)

## 2016-08-17 LAB — CBC
HEMATOCRIT: 34.1 % — AB (ref 39.0–52.0)
Hemoglobin: 10.9 g/dL — ABNORMAL LOW (ref 13.0–17.0)
MCH: 23.9 pg — ABNORMAL LOW (ref 26.0–34.0)
MCHC: 32 g/dL (ref 30.0–36.0)
MCV: 74.8 fL — AB (ref 78.0–100.0)
PLATELETS: 219 10*3/uL (ref 150–400)
RBC: 4.56 MIL/uL (ref 4.22–5.81)
RDW: 15.8 % — ABNORMAL HIGH (ref 11.5–15.5)
WBC: 9.6 10*3/uL (ref 4.0–10.5)

## 2016-08-17 MED ORDER — POTASSIUM CHLORIDE CRYS ER 20 MEQ PO TBCR
40.0000 meq | EXTENDED_RELEASE_TABLET | Freq: Once | ORAL | Status: AC
  Administered 2016-08-17: 40 meq via ORAL
  Filled 2016-08-17: qty 2

## 2016-08-17 MED ORDER — CARVEDILOL 12.5 MG PO TABS
12.5000 mg | ORAL_TABLET | Freq: Two times a day (BID) | ORAL | Status: DC
  Administered 2016-08-18: 12.5 mg via ORAL
  Filled 2016-08-17: qty 1

## 2016-08-17 NOTE — Progress Notes (Signed)
Family Medicine Teaching Service Daily Progress Note Intern Pager: [phone]  [facility] number: [id number] Date of birth: 1941-01-09 Age: 75 y.o. Gender: male  Primary Care Provider: Sanjuana LettersHENSEL,WILLIAM ARTHUR, MD Consultants: None Code Status: Full  Pt Overview and Major Events to Date:  1. Admit to FMTS 08/12/16  Assessment and Plan: Shane Mathews is a 75 y.o. male presenting with exertional SOB . PMH is significant for HTN, chronic venous insufficiency, Anxiety, GERD, Claudication, Hyperlipidemia  Dyspnea on Exertion, improving:   Presentation is concerning for new onset heart failure given history of orthopnea and BNP of 498.9 and finding of vascular congestion on chest x-ray, pitting edema in lower extremities and bibasilar crackles. Echo showed normal systolic function with normal EF. Weight increased 1.5 pounds past 24 hours; total -22.5 pounds since admit. Lung exam improved with fewer crackles today.   - Strict I's and O's - Daily weights - Continue 60 mg IV Lasix BID - Cardiology following - appreciate recs  New onset afb:  Noted on EKG. No history of syncope or CVA/TIA. RRR on cardiac exam this AM. One episode of bradycardia to 47 yesterday; otherwise HR WNL. Currently holding BB.  - Cardiology consulted; appreciate recommendations - Apixaban 5mg  BID - Continue to monitor SOB - Consider TEE/DCCV if no improvement in symptoms  Hypertension: On six BP meds at home.  Holding HCTZ due to recent history of gout and holding BB due to bradycardia. Working up for secondary hypertension with renal artery US that was limited due to body habitus and gas. 24hr urine metanepherines and cortisol pending. BP remains elevated up to 165/52. - Continue norvasc 10mg  daily, enalapril 40mg  BID and hydralazine 100mg  TID, terazosin 20mg  daily, spironolactone 50mg , Coreg 12mg  BID - F/u 24hr cortisol and VMA - Consider additional renal imaging after  discharge  Cough, stable: Productive cough clear sputum for past 6 mo months.  Former smoker quit over 20 years ago.  No sick contacts and denies any chest pain, fevers or upper respiratory symptoms.  Does have history of GERD and takes Pepcid.  Also has a history of childhood asthma with unknown PFT results.  Does not use any inhalers. Chest x-ray showed bilateral pleural effusions and vascular congestion and lungs were actually hypoinflated.   - Continue to monitor - Tessalon Perles 100mg  TID PRN - Consider albuterol PRN and PFTs  Anemia, stable: Hemoglobin of 11.2 noted on admission. MCV 73. Due for colonoscopy. FOBT negative in 08/2015. Hgb 10.9 this AM.  - Daily CBC - Consider Iron studies if no improvement  History of gout, stable:  - Cont febuxostat 40mg  qd  Hypokalemia, improving: 2.8 upon presentation. Normalized at 3.5 this AM.  - F/u BMP - Continue spironolactone 50mg  daily  Panic Attacks, stable: Last documentation and office visit was in 2014. Currently only on Paxil 20 mg daily.   - Continue Paxil 20 mg qd - Ativan 0.5mg  BID q4hrs PRN  Hypertriglyceridemia: Lipid panel showed LDL 36, otherwise normal. - Continue lipitor 40mg  qd  GERD:  - Cont home Protonix 20mg  qd  Intermittent Claudication:  Reported claudication in bilateral lower extremities in the past. ABIs show moderate arterial disease (ABI: R: 0.71, L: 0.69). Patient denies current symptoms.  - Continue prevastatin 40mg  qd - Consider TED hose - Consider vascular f/u outpatient  FEN/GI: Heart healthy diet/ protonix Prophylaxis: subQ heparin  Disposition: discharge home when stable  Subjective:  Patient reports feeling much better this AM. Says he is not  having nearly as much difficulty breathing. Reports minimal DOE, but says he thinks this is his baseline. Reports cough has improved as well. No other complaints.   Objective: Temp:  [98.1 F (36.7 C)-98.8 F (37.1 C)] 98.8 F (37.1 C) (12/10  0516) Pulse Rate:  [47-76] 76 (12/10 0516) Resp:  [17-18] 18 (12/10 0516) BP: (138-165)/(50-53) 165/52 (12/10 0516) SpO2:  [93 %-100 %] 95 % (12/10 0516) Weight:  [284 lb 14.4 oz (129.2 kg)] 284 lb 14.4 oz (129.2 kg) (12/10 0516) Physical Exam: General: very pleasant 75 year old male sitting up in bed in NAD ENTM: MMM Cardiovascular: RRR, no murmurs appreciated Respiratory: NWOB on RA, CTAB, crackles at bases Gastrointestinal: soft, NTND, +BS MSK: Moves all extremities spontaneously Extremities: 2+ pitting edema in lower extremities up to the knees bilaterally Derm: Chronic venous stasis changes seen on bilateral lower extremities  Neuro: A&Ox3 Psych: Appropriate mood and affect  Laboratory:  Recent Labs Lab 08/13/16 0339 08/15/16 0517 08/17/16 0857  WBC 7.8 10.3 9.6  HGB 10.5* 11.7* 10.9*  HCT 32.8* 36.2* 34.1*  PLT 199 215 219    Recent Labs Lab 08/12/16 1501  08/14/16 1608 08/16/16 0144 08/17/16 0158  NA 134*  < > 132* 134* 132*  K 2.8*  < > 3.3* 3.2* 3.5  CL 96*  < > 93* 93* 92*  CO2 33*  < > 29 30 29   BUN 10  < > 9 12 18   CREATININE 0.94  0.91  < > 1.05 0.98 1.10  CALCIUM 8.3*  < > 8.2* 8.3* 8.2*  PROT 5.8*  --   --   --   --   BILITOT 0.7  --   --   --   --   ALKPHOS 77  --   --   --   --   ALT 14*  --   --   --   --   AST 16  --   --   --   --   GLUCOSE 148*  < > 125* 141* 115*  < > = values in this interval not displayed.  Imaging/Diagnostic Tests: No results found.  Marquette SaaAbigail Joseph Dawnna Gritz, MD 08/17/2016, 9:52 AM PGY-1, [place] [facility] FPTS Intern pager: [phone], text pages welcome

## 2016-08-17 NOTE — Progress Notes (Signed)
Patient Name: Shane Mathews Date of Encounter: 08/17/2016  Primary Cardiologist:  New (Dr. Duke Salviaandolph)  [facility]     Principal Problem:   Pleural effusion Active Problems:   Dyspnea   Atrial fibrillation (HCC)   Edema   Hyperglycemia   Hypokalemia   Absent pulse in lower extremity   Bradycardia   Hypertensive heart disease   Acute on chronic diastolic heart failure (HCC)   Resistant hypertension    Subjective   Breathing better.  He has not been mobile because of left knee pain.   Inpatient Medications    Scheduled Meds: . amLODipine  10 mg Oral Daily  . apixaban  5 mg Oral BID  . atorvastatin  40 mg Oral q1800  . carvedilol  12.5 mg Oral BID WC  . enalapril  20 mg Oral BID  . febuxostat  40 mg Oral Daily  . furosemide  60 mg Intravenous BID  . hydrALAZINE  100 mg Oral Q8H  . LORazepam  0.5 mg Oral BID  . pantoprazole  20 mg Oral Daily  . PARoxetine  20 mg Oral Daily  . spironolactone  50 mg Oral Daily  . terazosin  20 mg Oral QHS  . triamcinolone ointment   Topical BID   Continuous Infusions:  PRN Meds: benzonatate, guaiFENesin-dextromethorphan, LORazepam, traMADol   Vital Signs    Vitals:   08/16/16 0556 08/16/16 1134 08/16/16 1952 08/17/16 0516  BP: (!) 166/59 (!) 147/50 (!) 138/53 (!) 165/52  Pulse: 80 73 (!) 47 76  Resp: 18 18 17 18   Temp: 98 F (36.7 C) 98.1 F (36.7 C) 98.4 F (36.9 C) 98.8 F (37.1 C)  TempSrc: Oral Oral Oral Oral  SpO2: 98% 100% 93% 95%  Weight: 283 lb 6.4 oz (128.5 kg)   284 lb 14.4 oz (129.2 kg)  Height:        Intake/Output Summary (Last 24 hours) at 08/17/16 1138 Last data filed at 08/17/16 1116  Gross per 24 hour  Intake              560 ml  Output             1200 ml  Net             -640 ml   Filed Weights   08/15/16 0531 08/16/16 0556 08/17/16 0516  Weight: 284 lb 9.6 oz (129.1 kg) 283 lb 6.4 oz (128.5 kg) 284 lb 14.4 oz (129.2 kg)    Physical Exam    GEN: NAD.  Neck:  Positive JVD 8  cm Cardiac: Irregular Rate and Rhythm, murmurs, rubs, or gallops.  Mild edema.  Radials/DP/PT 2+  and equal bilaterally.  Respiratory:  Respirations  regular and unlabored, clear to auscultation bilaterally. GI: Soft, nontender, nondistended, BS + x 4. Skin: warm and dry, no rash. Neuro:   Strength and sensation are intact. Psych:  AAOx3.  Normal affect.  Labs    CBC  Recent Labs  08/15/16 0517 08/17/16 0857  WBC 10.3 9.6  HGB 11.7* 10.9*  HCT 36.2* 34.1*  MCV 73.7* 74.8*  PLT 215 219   Basic Metabolic Panel  Recent Labs  08/16/16 0144 08/17/16 0158  NA 134* 132*  K 3.2* 3.5  CL 93* 92*  CO2 30 29  GLUCOSE 141* 115*  BUN 12 18  CREATININE 0.98 1.10  CALCIUM 8.3* 8.2*   Liver Function Tests No results for input(s): AST, ALT, ALKPHOS, BILITOT, PROT, ALBUMIN in the last 72  hours. No results for input(s): LIPASE, AMYLASE in the last 72 hours. Cardiac Enzymes No results for input(s): CKTOTAL, CKMB, CKMBINDEX, TROPONINI in the last 72 hours. BNP Invalid input(s): POCBNP D-Dimer No results for input(s): DDIMER in the last 72 hours. Hemoglobin A1C No results for input(s): HGBA1C in the last 72 hours. Fasting Lipid Panel No results for input(s): CHOL, HDL, LDLCALC, TRIG, CHOLHDL, LDLDIRECT in the last 72 hours. Thyroid Function Tests No results for input(s): TSH, T4TOTAL, T3FREE, THYROIDAB in the last 72 hours.  Invalid input(s): FREET3  Telemetry    Atrial fib, rate well controlled. - Personally Reviewed  ECG    NA - Personally Reviewed  Radiology    No results found.  Cardiac Studies   Echo 08/13/16: Study Conclusions  - Left ventricle: The cavity size was normal. Wall thickness was increased in a pattern of moderate LVH. Systolic function was normal. The estimated ejection fraction was in the range of 60% to 65%. Wall motion was normal; there were no regional wall motion abnormalities. - Mitral valve: There was mild regurgitation. - Left  atrium: The atrium was severely dilated.  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  Right TBI of 0.46 and left TBI of 0.47 are suggestive of abnormal arterial flow at rest.  Patient Profile     Shane Mathews is a 33M with hypertension, hyperlipidemia, OSA and GERD here with newly diagnosed atrial fibrillation with slow ventricular response and acute diastolic heart failure.  Assessment & Plan    ATRIAL FIB:  On Eliquis.    Shane Mathews has a CHA2DS2 - VASc score of 4 with a risk of stroke of 4%. Rate OK.  No change in therapy.  HYPERTENSIVE HEART DISEASE:  BP is still mildly elevated.  Primary team evaluating secondary causes.   ACUTE ON CHRONIC DIASTOLIC HF:   Minus 8.85 liters since admission.   W  I would continue IV diuresis today.  We talked about keeping his feet up today.   HYPOKALEMIA:  I will order 40 meq of potassium again today. Follow BMET.    Signed, Rollene RotundaJames Eliezer Khawaja, MD  08/17/2016, 11:38 AM

## 2016-08-18 ENCOUNTER — Encounter: Payer: Self-pay | Admitting: *Deleted

## 2016-08-18 DIAGNOSIS — I739 Peripheral vascular disease, unspecified: Secondary | ICD-10-CM | POA: Diagnosis present

## 2016-08-18 DIAGNOSIS — R946 Abnormal results of thyroid function studies: Secondary | ICD-10-CM

## 2016-08-18 LAB — BASIC METABOLIC PANEL
Anion gap: 10 (ref 5–15)
BUN: 17 mg/dL (ref 6–20)
CHLORIDE: 94 mmol/L — AB (ref 101–111)
CO2: 29 mmol/L (ref 22–32)
Calcium: 8.5 mg/dL — ABNORMAL LOW (ref 8.9–10.3)
Creatinine, Ser: 1.03 mg/dL (ref 0.61–1.24)
GFR calc non Af Amer: >60 mL/min (ref >60)
Glucose, Bld: 130 mg/dL — ABNORMAL HIGH (ref 65–99)
POTASSIUM: 3.7 mmol/L (ref 3.5–5.1)
SODIUM: 133 mmol/L — AB (ref 135–145)

## 2016-08-18 LAB — CBC
HEMATOCRIT: 34.2 % — AB (ref 39.0–52.0)
HEMOGLOBIN: 11 g/dL — AB (ref 13.0–17.0)
MCH: 23.8 pg — AB (ref 26.0–34.0)
MCHC: 32.2 g/dL (ref 30.0–36.0)
MCV: 74 fL — ABNORMAL LOW (ref 78.0–100.0)
Platelets: 212 10*3/uL (ref 150–400)
RBC: 4.62 MIL/uL (ref 4.22–5.81)
RDW: 15.7 % — ABNORMAL HIGH (ref 11.5–15.5)
WBC: 9.5 10*3/uL (ref 4.0–10.5)

## 2016-08-18 MED ORDER — PREDNISONE 20 MG PO TABS
30.0000 mg | ORAL_TABLET | Freq: Every day | ORAL | Status: DC
  Administered 2016-08-18 – 2016-08-19 (×2): 30 mg via ORAL
  Filled 2016-08-18 (×3): qty 1

## 2016-08-18 MED ORDER — SPIRONOLACTONE 25 MG PO TABS: 100.0000 mg | ORAL_TABLET | Freq: Every day | ORAL | Status: DC

## 2016-08-18 MED ORDER — CARVEDILOL 6.25 MG PO TABS
6.2500 mg | ORAL_TABLET | Freq: Two times a day (BID) | ORAL | Status: DC
  Administered 2016-08-18 – 2016-08-21 (×7): 6.25 mg via ORAL
  Filled 2016-08-18 (×7): qty 1

## 2016-08-18 NOTE — Progress Notes (Signed)
OT Cancellation Note  Patient Details Name: Shane Mathews MRN: [id number] DOB: 08-02-1941   Cancelled Treatment:    Reason Eval/Treat Not Completed: Pain limiting ability to participate;Other (comment). Pt continues to refuse therapy due to pain in L knee. Pt requesting more ice in ice pack. Pt's nurse tech notified  Galen ManilaSpencer, Yohan Samons Jeanette 08/18/2016, 1:02 PM

## 2016-08-18 NOTE — Progress Notes (Signed)
[facility] Date of Encounter: 08/18/2016  Primary Cardiologist: Dr. Williemae Areaandolph  Hospital Problem List     Principal Problem:   Pleural effusion Active Problems:   Dyspnea   Persistent atrial fibrillation (HCC)   Edema   Hyperglycemia   Hypokalemia   Absent pulse in lower extremity   Bradycardia   Hypertensive heart disease   Acute on chronic diastolic heart failure (HCC)   Resistant hypertension   PAD (peripheral artery disease) (HCC)    Subjective   Still feels volume overloaded. He's not sure of what his baseline weight is. Edema and dyspnea are improving but not back to baseline yet. He spoke with primary team this AM about possible gout.  Inpatient Medications    . amLODipine  10 mg Oral Daily  . apixaban  5 mg Oral BID  . atorvastatin  40 mg Oral q1800  . carvedilol  12.5 mg Oral BID WC  . enalapril  20 mg Oral BID  . febuxostat  40 mg Oral Daily  . furosemide  60 mg Intravenous BID  . hydrALAZINE  100 mg Oral Q8H  . LORazepam  0.5 mg Oral BID  . pantoprazole  20 mg Oral Daily  . PARoxetine  20 mg Oral Daily  . spironolactone  50 mg Oral Daily  . terazosin  20 mg Oral QHS  . triamcinolone ointment   Topical BID    Vital Signs    Vitals:   08/17/16 1208 08/17/16 1400 08/17/16 2053 08/18/16 0535  BP: (!) 147/56 125/85 120/74 (!) 162/59  Pulse: (!) 59 66 (!) 43 66  Resp: 18  18 18   Temp: 97.5 F (36.4 C)  98.6 F (37 C) 98 F (36.7 C)  TempSrc: Oral  Oral Oral  SpO2: 96%  97% 97%  Weight:    284 lb 8 oz (129 kg)  Height:        Intake/Output Summary (Last 24 hours) at 08/18/16 0958 Last data filed at 08/18/16 0948  Gross per 24 hour  Intake             1430 ml  Output             2976 ml  Net            -1546 ml   Filed Weights   08/16/16 0556 08/17/16 0516 08/18/16 0535  Weight: 283 lb 6.4 oz (128.5 kg) 284 lb 14.4 oz (129.2 kg) 284 lb 8 oz (129 kg)    Physical Exam    General: Well developed obese WM  in no acute  distress. HEENT: Normocephalic, atraumatic, sclera non-icteric, no xanthomas, nares are without discharge. Neck: Negative for carotid bruits. JVP not elevated. No subclavian bruits. Lungs: Clear bilaterally to auscultation without wheezes, rales, or rhonchi. Breathing is unlabored. Cardiac: Irregularly irregular, rate controlled. S1 S2 without murmurs, rubs, or gallops.  Abdomen: Soft, non-tender, non-distended with normoactive bowel sounds. No rebound/guarding. Extremities: No clubbing or cyanosis. Mild BLE edema with chronic skin thickening noted. Skin: Warm and dry. Neuro: Alert and oriented X 3. Sensation in tact. Follows commands. Psych:  Responds to questions appropriately with a normal affect.  Labs    CBC  Recent Labs  08/17/16 0857 08/18/16 0523  WBC 9.6 9.5  HGB 10.9* 11.0*  HCT 34.1* 34.2*  MCV 74.8* 74.0*  PLT 219 212   Basic Metabolic Panel  Recent Labs  08/17/16 0158 08/18/16 0523  NA 132* 133*  K 3.5 3.7  CL  92* 94*  CO2 29 29  GLUCOSE 115* 130*  BUN 18 17  CREATININE 1.10 1.03  CALCIUM 8.2* 8.5*   Telemetry    Persistent atrial fib, rates down to upper 30s at times   Radiology    Dg Chest 2 View  Result Date: 08/12/2016 CLINICAL DATA:  Initial evaluation for acute dyspnea, shortness of breath. EXAM: CHEST  2 VIEW COMPARISON:  None available. FINDINGS: Moderate cardiomegaly. Mediastinal silhouette within normal limits. Aortic atherosclerosis noted. Lungs somewhat hypoinflated. Mild vascular congestion without overt pulmonary edema. Small bilateral pleural effusions. Associated bibasilar opacities favored to reflect effusion and/or atelectasis. Superimposed infiltrates not entirely excluded, particularly at the right lung base. No other focal airspace disease. No pneumothorax. No acute osseous abnormality. IMPRESSION: 1. Small layering bilateral pleural effusions with associated bibasilar opacities. Atelectasis is favored, although infiltrates could be  considered in the correct clinical setting. 2. Moderate cardiomegaly with mild diffuse pulmonary vascular congestion without overt pulmonary edema. 3. Aortic atherosclerosis. Electronically Signed   By: Rise MuBenjamin  McClintock M.D.   On: 08/12/2016 13:14     Patient Profile     17M with hypertension, hyperlipidemia, OSA, obesity, venous insufficiency, anxiety, claudication (prior duplex ordered and never performed - this adm with abnormal ABIs), RBBB and GERD here with newly diagnosed atrial fibrillation with slow ventricular response and acute diastolic heart failure. 2D Echo 08/13/16: moderate LVH, EF 60-65%, mild MR, severely dilated LA. ABIs 08/13/16: 0.71 right, 0.69 left.  Assessment & Plan    1. Acute diastolic CHF - continues to diurese well on present regimen. He still appears volume overloaded. Continue IV lasix and spironolactone. He is down 10L and 23lb already. Dry weight not really known. Will consult dietician to reinforce HF diet education.  2. Persistent atrial fib - unknown duration. CHADSVASC 5. Now on apixaban. Do not see prior mention of plans for rhythm control, but may be reasonable to plan for 3 weeks of uninterrupted anticoagulation with outpatient DCCV.  3. Intermittent bradycardia - HR occasionally drops to the upper 30s, low 40s even during day hours. Will decrease beta blocker dose. Given patient's decreased functional status per PT reports, do not want HR to fall too low too often.  4. Hypertension, seemingly resistant - being worked up by internal med for secondary causes. Renal artery duplex was non-diagnostic study due to patient body habitus, bowel gas, and patient positioning. May need to consider CTA to assess.  5. Abnormal free T4 - subclinical hyperthyroidism has been implicated in increased risk of afib but this is moreso in patients with TSH <0.1. This will need attention in follow-up by primary team.  6. PAD by abnl ABIs - asymptomatic. Follow clinically.  7.  ?OSA - listed in H/p, patient does not have CPAP at home. Recommend this be evaluated at dc as it can contribute to his AF.  Signed, Laurann Montanaayna N Dunn, PA-C  08/18/2016, 9:58 AM  Patient seen and examined. I agree with the assessment and plan as detailed above. See also my additional thoughts below.   Plan to continue diuresis as outlined above.  Willa RoughJeffrey Lorita Forinash, MD, [facility] 08/18/2016 11:04 AM

## 2016-08-18 NOTE — Progress Notes (Signed)
Pt had a drop in heart rate down in the 30s non-sustaining MD Aware and has adjusted Coreg to a lower dose.

## 2016-08-18 NOTE — NC FL2 (Signed)
[place] MEDICAID FL2 LEVEL OF CARE SCREENING TOOL     IDENTIFICATION  Patient Name: Fayrene Fearingnthony H Sepulveda Birthdate: 1941-07-27 Sex: male Admission Date (Current Location): 08/12/2016  [facility] [place]   Facility and Address:  [facility] [place], [address], [place], [place] [address]      Provider Number: [id number]  Attending Physician Name and Address:  Leighton Roachodd D McDiarmid, MD  Relative Name and Phone Number:       Current Level of Care: Hospital Recommended Level of Care: Skilled Nursing Facility Prior Approval Number:    Date Approved/Denied:   PASRR Number: Pending  Discharge Plan: SNF    Current Diagnoses: Patient Active Problem List   Diagnosis Date Noted  . PAD (peripheral artery disease) (HCC) 08/18/2016  . Abnormal thyroid function test 08/18/2016  . Resistant hypertension   . Hypertensive heart disease 08/14/2016  . Acute on chronic diastolic heart failure (HCC) 08/14/2016  . Hyperglycemia 08/13/2016  . Hypokalemia 08/13/2016  . Pleural effusion 08/13/2016  . Absent pulse in lower extremity 08/13/2016  . Bradycardia   . Dyspnea 08/12/2016  . Persistent atrial fibrillation (HCC)   . Edema   . Atherosclerosis of native arteries of extremity with intermittent claudication (HCC) 08/09/2015  . Anemia, iron deficiency 03/17/2014  . Chronic venous insufficiency 03/15/2014  . Pure hypercholesterolemia 12/01/2012  . HIP PAIN 02/27/2010  . GOUT, UNSPECIFIED 04/20/2009  . PANIC ATTACK 01/08/2007  . HYPERTRIGLYCERIDEMIA 11/05/2006  . Morbid obesity (HCC) 11/05/2006  . CATARACT 11/05/2006  . REFLUX ESOPHAGITIS 11/05/2006    Orientation RESPIRATION BLADDER Height & Weight     Self, Time, Situation, Place  Normal Continent Weight: 284 lb 8 oz (129 kg) (pt refused to stand due to pain in knee) Height:  6' (182.9 cm)  BEHAVIORAL SYMPTOMS/MOOD NEUROLOGICAL BOWEL NUTRITION STATUS   (Refusing therapies due to gout pain)   (None) Continent Diet (Heart healthy: Pt does not eat beef, no red meat, no fish and red meat)  AMBULATORY STATUS COMMUNICATION OF NEEDS Skin   Extensive Assist Verbally Other (Comment) (Cellulitis, Contact dermatitis, Cracking)                       Personal Care Assistance Level of Assistance  Bathing, Feeding, Dressing Bathing Assistance: Limited assistance Feeding assistance: Independent Dressing Assistance: Maximum assistance     Functional Limitations Info  Sight, Hearing, Speech Sight Info: Adequate Hearing Info: Adequate Speech Info: Adequate    SPECIAL CARE FACTORS FREQUENCY  Blood pressure     PT Frequency: 5 x week OT Frequency: 3 x week            Contractures Contractures Info: Not present    Additional Factors Info  Psychotropic Code Status Info: Full Allergies Info: Asa (Aspirin), Allopurinol, Other Psychotropic Info: Panic attack: Ativan 0.5 mg PO BID, Paxil 20 mg PO daily, Ativan 0.5 mg PO every 4 hours prn         Current Medications (08/18/2016):  This is the current hospital active medication list Current Facility-Administered Medications  Medication Dose Route Frequency Provider Last Rate Last Dose  . amLODipine (NORVASC) tablet 10 mg  10 mg Oral Daily Renne Muscaaniel L Warden, MD   10 mg at 08/18/16 1058  . apixaban (ELIQUIS) tablet 5 mg  5 mg Oral BID Earnie LarssonFrank R Wilson, RPH   5 mg at 08/18/16 1058  . atorvastatin (LIPITOR) tablet 40 mg  40 mg Oral q1800 Tillman SersAngela C Riccio,  DO   40 mg at 08/17/16 1703  . benzonatate (TESSALON) capsule 100 mg  100 mg Oral TID PRN Renne Muscaaniel L Warden, MD   100 mg at 08/13/16 0940  . carvedilol (COREG) tablet 6.25 mg  6.25 mg Oral BID WC Dayna N Dunn, PA-C      . enalapril (VASOTEC) tablet 20 mg  20 mg Oral BID Renne Muscaaniel L Warden, MD   20 mg at 08/17/16 2155  . febuxostat (ULORIC) tablet 40 mg  40 mg Oral Daily Uvaldo RisingKyle J Fletke, MD   40 mg at 08/18/16 1058  . furosemide (LASIX) injection 60 mg  60 mg Intravenous BID Chilton Siiffany [place],  MD   60 mg at 08/18/16 0839  . guaiFENesin-dextromethorphan (ROBITUSSIN DM) 100-10 MG/5ML syrup 5 mL  5 mL Oral Q4H PRN Leighton Roachodd D McDiarmid, MD   5 mL at 08/13/16 0939  . hydrALAZINE (APRESOLINE) tablet 100 mg  100 mg Oral Q8H Chilton Siiffany [place], MD   100 mg at 08/18/16 0629  . LORazepam (ATIVAN) tablet 0.5 mg  0.5 mg Oral BID Moses MannersWilliam A Hensel, MD   Stopped at 08/18/16 1100  . LORazepam (ATIVAN) tablet 0.5 mg  0.5 mg Oral Q4H PRN Leighton Roachodd D McDiarmid, MD   0.5 mg at 08/15/16 1951  . pantoprazole (PROTONIX) EC tablet 20 mg  20 mg Oral Daily Renne Muscaaniel L Warden, MD   20 mg at 08/18/16 1058  . PARoxetine (PAXIL) tablet 20 mg  20 mg Oral Daily Renne Muscaaniel L Warden, MD   20 mg at 08/18/16 1058  . predniSONE (DELTASONE) tablet 30 mg  30 mg Oral Q breakfast Renne Muscaaniel L Warden, MD      . Melene Muller[START ON 08/19/2016] spironolactone (ALDACTONE) tablet 100 mg  100 mg Oral Daily Renne Muscaaniel L Warden, MD      . terazosin (HYTRIN) capsule 20 mg  20 mg Oral QHS Chilton Siiffany [place]ijer[place], MD   20 mg at 08/17/16 2155  . traMADol (ULTRAM) tablet 50 mg  50 mg Oral Q6H PRN Moses MannersWilliam A Hensel, MD      . triamcinolone ointment (KENALOG) 0.1 %   Topical BID Renne Muscaaniel L Warden, MD         Discharge Medications: Please see discharge summary for a list of discharge medications.  Relevant Imaging Results:  Relevant Lab Results:   Additional Information SS#: [id number]  Margarito LinerSarah C Chimene Salo, LCSW

## 2016-08-18 NOTE — Progress Notes (Signed)
Family Medicine Teaching Service Daily Progress Note Intern Pager: [phone]  Patient [facility]: [name] Medical record number: [id number] Date of birth: July 21, 1941 Age: 75 y.o. Gender: male  Primary Care Provider: Sanjuana LettersHENSEL,WILLIAM ARTHUR, MD Consultants: None Code Status: Full  Pt Overview and Major Events to Date:  1. Admit to FMTS 08/12/16  Assessment and Plan: Fayrene Fearingnthony H Schoppe is a 75 y.o. male presenting with exertional SOB . PMH is significant for HTN, chronic venous insufficiency, Anxiety, GERD, Claudication, Hyperlipidemia  Dyspnea on Exertion, improving:   Presentation is concerning for new onset heart failure given history of orthopnea and BNP of 498.9 and finding of vascular congestion on chest x-ray, pitting edema in lower extremities and bibasilar crackles. Echo showed normal systolic function with normal EF. Weight stable from yesterday, total -23 pounds since admit. Lung exam continuing to improve with fewer crackles today.  - Strict I's and O's - Daily weights - Continue 60 mg IV Lasix BID - Cardiology following - appreciate recs  # Intermittent bradycardia: Cardiology noted a drop to 30s-40s even during daytime hours.  Asymptomatic.  - decrease coreg to 6.25mg  BID - cont to monitor.   New onset afb:  Noted on EKG. No history of syncope or CVA/TIA. RRR on cardiac exam this AM. Dr. Myrtis SerKatz notes that a reasonable plan would be to plan for 3 weeks uninterrupted anticoagulation with putpatient DCCV in 3 weeks.  - Cardiology consulted; appreciate recommendations - Apixaban 5mg  BID - Continue to monitor SOB - possible DCCV in 3 weeks outpatient.   Hypertension: On six BP meds at home.  Holding HCTZ due to recent history of gout and holding BB due to bradycardia.  24hr urine metanepherines and cortisol pending. BP remains elevated up to 165/59. - Continue norvasc 10mg  daily, enalapril 20mg  BID and hydralazine 100mg  TID, terazosin 20mg  daily, increased spironolactone to 100mg ,  coreg 6.25mg  BID - F/u 24hr cortisol and VMA - Consider additional renal imaging after discharge  Cough, stable: Productive cough clear sputum for past 6 mo months.  Former smoker quit over 20 years ago.  No sick contacts and denies any chest pain, fevers or upper respiratory symptoms.  Does have history of GERD and takes Pepcid.  Also has a history of childhood asthma with unknown PFT results.  Does not use any inhalers. Chest x-ray showed bilateral pleural effusions and vascular congestion and lungs were actually hypoinflated.   - Continue to monitor - Tessalon Perles 100mg  TID PRN - Consider albuterol PRN and PFTs  Anemia, stable: Hemoglobin of 11.2 noted on admission. MCV 73. Due for colonoscopy. FOBT negative in 08/2015. Hgb 10.9 this AM.  - Daily CBC - Consider Iron studies if no improvement  History of gout:  Patient complaining of acute L knee pain that feels similar to prior gout flares.  He is on fuboxostat daily currently. Given his history of poorly controlled HTN and new onset heart failure diagnosis.  Appropriate to consider steroid course.  Encourage PT/OT.  - start 30mg  prednisone daily - Cont febuxostat 40mg  qd  Hypokalemia, improving: 2.8 upon presentation. Normalized at 3.7 this AM.  - F/u BMP - increased spironolactone to 100mg  daily  Panic Attacks, stable: Last documentation and office visit was in 2014. Currently only on Paxil 20 mg daily.   - Continue Paxil 20 mg qd - Ativan 0.5mg  BID q4hrs PRN  Hypertriglyceridemia: Lipid panel showed LDL 36, otherwise normal. - Continue lipitor 40mg  qd  GERD:  - Cont home Protonix 20mg  qd  Intermittent  Claudication:  Reported claudication in bilateral lower extremities in the past. ABIs show moderate arterial disease (ABI: R: 0.71, L: 0.69). Patient denies current symptoms.  - Continue lipitor 40mg  daily - Consider TED hose - Consider vascular f/u outpatient  FEN/GI: Heart healthy diet/ protonix Prophylaxis: subQ  heparin  Disposition: discharge home when stable  Subjective:  Patient reports feeling much better this AM with respect to his breathing and shortness of breath.  Does have new onset knee pain yesterday. "Feels like a gout attack".  Currently has ice pack on knee and it is helping a bit.   Objective: Temp:  [97.5 F (36.4 C)-98.6 F (37 C)] 98 F (36.7 C) (12/11 0535) Pulse Rate:  [43-66] 66 (12/11 0535) Resp:  [18] 18 (12/11 0535) BP: (120-162)/(56-85) 162/59 (12/11 0535) SpO2:  [96 %-97 %] 97 % (12/11 0535) Weight:  [284 lb 8 oz (129 kg)] 284 lb 8 oz (129 kg) (12/11 0535) Physical Exam: General: very pleasant 75 year old male sitting up in bed in NAD ENTM: MMM Cardiovascular: RRR, no murmurs appreciated Respiratory: NWOB on RA, CTAB, crackles at bases Gastrointestinal: soft, NTND, +BS MSK: Moves all extremities spontaneously, pain in right knee, tender to palpation. No erythema or warmth.  Extremities: 2+ pitting edema in lower extremities up to shins bilaterally.  Derm: Chronic venous stasis changes seen on bilateral lower extremities  Neuro: A&Ox3 Psych: Appropriate mood and affect  Laboratory:  Recent Labs Lab 08/15/16 0517 08/17/16 0857 08/18/16 0523  WBC 10.3 9.6 PENDING  HGB 11.7* 10.9* 11.0*  HCT 36.2* 34.1* 34.2*  PLT 215 219 212    Recent Labs Lab 08/12/16 1501  08/16/16 0144 08/17/16 0158 08/18/16 0523  NA 134*  < > 134* 132* 133*  K 2.8*  < > 3.2* 3.5 3.7  CL 96*  < > 93* 92* 94*  CO2 33*  < > 30 29 29   BUN 10  < > 12 18 17   CREATININE 0.94  0.91  < > 0.98 1.10 1.03  CALCIUM 8.3*  < > 8.3* 8.2* 8.5*  PROT 5.8*  --   --   --   --   BILITOT 0.7  --   --   --   --   ALKPHOS 77  --   --   --   --   ALT 14*  --   --   --   --   AST 16  --   --   --   --   GLUCOSE 148*  < > 141* 115* 130*  < > = values in this interval not displayed.  Imaging/Diagnostic Tests: No results found.  Renne Muscaaniel L Jessic Standifer, MD 08/18/2016, 7:03 AM PGY-1, [facility]  Family Medicine FPTS Intern pager: [phone], text pages welcome

## 2016-08-18 NOTE — Progress Notes (Signed)
Transitions of Care Hospital Encounter Note:       Admit date: 08/12/2016 Best patient contact number: Home [phone]  Emergency contact(s): Leamon Palau (wife) [phone] Alternate number: None PCP: [facility]  Principal Diagnosis: Acute SHOB on exertion with new onset A-fib and CHF  Reason for Chronic Case Management: Co-morbidities as follows: HTN, Chronic venous insufficiency, claudication, anxiety, morbid obesity; 1 admission within last 6 months  Met with patient to discuss Transitions of Care. Patient states he's feeling "much better, only a little [facility]" Patient states he lives at home with wife. Home is one level with 5-6 outside stairs with railing. Patient states he uses cane to ambulate and sometimes crutches 2/2 left knee pain. Patient states he is able to afford all his medications. Denies transportation needs or food insecurity. Patient states he has been learning about new dx of CHF while in hospital and has a home scale. Discussed need to weigh himself daily post discharge and contact office for weight gain of 4 lbs within 2 day period. Patient verbalizes understanding. Discussed need for low sodium diet. Patient requesting diet instructions at discharge. Patient denies need for home health services at this time.  Patient aware that this RN will be calling him within 2 days of hospital discharge.  HFU/TOC appt made for 08/21/2016 at 1045 with PCP   Identified Barriers: None at this time  Patient denies any other questions or concerns at this time.  Hubbard Hartshorn, RN, BSN

## 2016-08-18 NOTE — Progress Notes (Signed)
PCP social visit. Considering rehab for strengthening.  C/O difficulty standing due to severe knee pain, which he attributes to gout.  Would like to get this pain under better control so that he can participate in rehab.  Will talk with the team.  Perhaps add colchicine.

## 2016-08-18 NOTE — Progress Notes (Signed)
Noted and agree. 

## 2016-08-18 NOTE — Clinical Social Work Note (Signed)
Clinical Social Work Assessment  Patient Details  Name: Shane Mathews MRN: [id number] Date of Birth: 04-Oct-1940  Date of referral:  08/18/16               Reason for consult:  Facility Placement, Discharge Planning                Permission sought to share information with:  Facility Sport and exercise psychologist, Family Supports Permission granted to share information::  Yes, Verbal Permission Granted  Name::     [employer]  Agency::  SNF's  Relationship::  Wife  Contact Information:  [phone]  Housing/Transportation Living arrangements for the past 2 months:  [place] of Information:  Patient, Medical Team, Spouse Patient Interpreter Needed:  None Criminal Activity/Legal Involvement Pertinent to Current Situation/Hospitalization:  No - Comment as needed Significant Relationships:  Spouse Lives with:  Spouse Do you feel safe going back to the place where you live?  Yes Need for family participation in patient care:  Yes (Comment)  Care giving concerns:  PT recommending SNF once medically stable for discharge.   Social Worker assessment / plan:  CSW met with patient. Wife at bedside. CSW introduced role and explained that PT recommendations would be discussed. SNF list provided. Patient's wife agreeable to SNF. Patient concerned that therapy will be no good with his gout pain. Patient is hopeful that his gout will clear and he can go home. Patient agreeable to having CSW fax information out to SNF's in the area in case they have to go that route. No further concerns. CSW encouraged patient and his wife to contact CSW as needed. CSW will continue to follow patient for support and facilitate discharge to SNF, if patient agreeable, once medically stable.  Employment status:  Retired Nurse, adult PT Recommendations:  [place] / Referral to community resources:  [place]  Patient/Family's  Response to care:  Patient's wife agreeable to SNF placement while patient prefers to go home if his gout clears. Patient is agreeable to CSW faxing out to SNF's in the area. Patient's wife supportive and involved in patient's care. Patient and his wife appreciated social work intervention.  Patient/Family's Understanding of and Emotional Response to Diagnosis, Current Treatment, and Prognosis:  Patient and his wife understand the discharge plan so far. Patient and his wife appear happy with hospital care.  Emotional Assessment Appearance:  Appears stated age Attitude/Demeanor/Rapport:  Other (Pleasant) Affect (typically observed):  Accepting, Appropriate, Calm, Pleasant Orientation:  Oriented to Self, Oriented to Place, Oriented to  Time, Oriented to Situation Alcohol / Substance use:  Never Used Psych involvement (Current and /or in the community):  No (Comment)  Discharge Needs  Concerns to be addressed:  Care Coordination Readmission within the last 30 days:  No Current discharge risk:  Dependent with Mobility Barriers to Discharge:  [place], LCSW 08/18/2016, 1:54 PM

## 2016-08-18 NOTE — Progress Notes (Signed)
OT Cancellation Note  Patient Details Name: Shane Fearingnthony H Altadonna MRN: [id number] DOB: [birth year]   Cancelled Treatment:    Reason Eval/Treat Not Completed: Pain limiting ability to participate;Other (comment). Pt adamantly refused getting to EOB or OOB stating, "I'm out of commission for therapy right now, this gout flare up hurst too much right now. Reiterated to pt the importance of activity, however pt still refused, but agreeable to OT to check back with him later  Galen ManilaSpencer, Estle Sabella Jeanette 08/18/2016, 10:09 AM

## 2016-08-18 NOTE — Clinical Social Work Placement (Signed)
   CLINICAL SOCIAL WORK PLACEMENT  NOTE  Date:  08/18/2016  Patient Details  Name: Shane Mathews MRN: [id number] Date of Birth: 28-Aug-1941  Clinical Social Work is seeking post-discharge placement for this patient at the Skilled  Nursing Facility level of care (*CSW will initial, date and re-position this form in  chart as items are completed):  Yes   Patient/family provided with [place] Clinical Social Work Department's list of facilities offering this level of care within the geographic area requested by the patient (or if unable, by the patient's family).  Yes   Patient/family informed of their freedom to choose among providers that offer the needed level of care, that participate in Medicare, Medicaid or managed care program needed by the patient, have an available bed and are willing to accept the patient.  Yes   Patient/family informed of [place]'s ownership interest in [facility] and [facility], as well as of the fact that they are under no obligation to receive care at these facilities.  PASRR submitted to EDS on 08/18/16     PASRR number received on       Existing PASRR number confirmed on       FL2 transmitted to all facilities in geographic area requested by pt/family on 08/18/16     FL2 transmitted to all facilities within larger geographic area on       Patient informed that his/her managed care company has contracts with or will negotiate with certain facilities, including the following:            Patient/family informed of bed offers received.  Patient chooses bed at       Physician recommends and patient chooses bed at      Patient to be transferred to   on  .  Patient to be transferred to facility by       Patient family notified on   of transfer.  Name of family member notified:        PHYSICIAN Please sign FL2     Additional Comment:    _______________________________________________ Margarito LinerSarah C Andra Heslin, LCSW 08/18/2016, 2:01  PM

## 2016-08-18 NOTE — Progress Notes (Signed)
PT Cancellation Note  Patient Details Name: Shane Mathews MRN: [id number] DOB: 1941/07/22   Cancelled Treatment:    Reason Eval/Treat Not Completed: Pain limiting ability to participate;Other (comment). Pt continues to refuse OT and PT therapy due to pain in L knee.   [name] J [name] 08/18/2016, 3:38 PM [name], PT DPT  [phone]

## 2016-08-19 ENCOUNTER — Encounter (HOSPITAL_COMMUNITY): Payer: Self-pay | Admitting: *Deleted

## 2016-08-19 DIAGNOSIS — E2740 Unspecified adrenocortical insufficiency: Secondary | ICD-10-CM

## 2016-08-19 DIAGNOSIS — J690 Pneumonitis due to inhalation of food and vomit: Secondary | ICD-10-CM

## 2016-08-19 LAB — BASIC METABOLIC PANEL
ANION GAP: 10 (ref 5–15)
BUN: 22 mg/dL — ABNORMAL HIGH (ref 6–20)
CALCIUM: 8.6 mg/dL — AB (ref 8.9–10.3)
CO2: 29 mmol/L (ref 22–32)
Chloride: 93 mmol/L — ABNORMAL LOW (ref 101–111)
Creatinine, Ser: 1.13 mg/dL (ref 0.61–1.24)
GFR calc Af Amer: >60 mL/min (ref >60)
Glucose, Bld: 169 mg/dL — ABNORMAL HIGH (ref 65–99)
POTASSIUM: 3.9 mmol/L (ref 3.5–5.1)
SODIUM: 132 mmol/L — AB (ref 135–145)

## 2016-08-19 LAB — ALDOSTERONE + RENIN ACTIVITY W/ RATIO
ALDO / PRA RATIO: UNDETERMINED
Aldosterone: <1 ng/dL (ref 0.0–30.0)

## 2016-08-19 MED ORDER — SPIRONOLACTONE 25 MG PO TABS
50.0000 mg | ORAL_TABLET | Freq: Every day | ORAL | Status: DC
  Administered 2016-08-19 – 2016-08-21 (×3): 50 mg via ORAL
  Filled 2016-08-19 (×3): qty 2

## 2016-08-19 MED ORDER — TERAZOSIN HCL 5 MG PO CAPS
10.0000 mg | ORAL_CAPSULE | Freq: Every day | ORAL | Status: DC
  Administered 2016-08-19 – 2016-08-20 (×2): 10 mg via ORAL
  Filled 2016-08-19 (×3): qty 2

## 2016-08-19 MED ORDER — COLCHICINE 0.6 MG PO TABS
0.6000 mg | ORAL_TABLET | Freq: Every day | ORAL | Status: DC
  Administered 2016-08-19 – 2016-08-21 (×3): 0.6 mg via ORAL
  Filled 2016-08-19 (×3): qty 1

## 2016-08-19 MED ORDER — TERAZOSIN HCL 5 MG PO CAPS: 10.0000 mg | ORAL_CAPSULE | Freq: Every day | ORAL | Status: DC

## 2016-08-19 NOTE — Progress Notes (Signed)
Nutrition Education Note  RD consulted for nutrition education regarding new onset CHF.  RD provided "Heart Failure Nutrition Therapy" handout from the Academy of [employer] and Dietetics. Reviewed patient's dietary recall. Provided examples on ways to decrease sodium intake in diet. Discouraged intake of processed foods and use of salt shaker. Encouraged fresh fruits and vegetables as well as whole grain sources of carbohydrates to maximize fiber intake.   RD discussed why it is important for patient to adhere to diet recommendations, and emphasized the role of fluids, foods to avoid, and importance of weighing self daily. Teach back method used. Pt states that the biggest change he is going to need to make is eating out less and checking nutrition labels. He plans to share handout provided with his wife who does the grocery shopping.   Expect good compliance.  Body mass index is 37.87 kg/m. Pt meets criteria for Obesity based on current BMI.  Current diet order is Heart Healthy, patient is consuming approximately 100% of meals at this time. Labs and medications reviewed. No further nutrition interventions warranted at this time. RD contact information provided. If additional nutrition issues arise, please re-consult RD.   Dorothea Ogleeanne Doak Mah RD, CSP, LDN Inpatient Clinical Dietitian Pager: [phone] After Hours Pager: [phone]

## 2016-08-19 NOTE — Progress Notes (Signed)
Pt is alert and oriented sitting up in the chair today and currently walking with therapy with a walker.

## 2016-08-19 NOTE — Progress Notes (Signed)
Occupational Therapy Treatment Patient Details Name: Shane Mathews MRN: [id number] DOB: 02-06-1941 Today's Date: 08/19/2016    History of present illness Pt is a 75 y/o male admitted with acute DOE/SOB/pleural effusion.  PMH:  AFib, HF, HTN, venous insufficiency   OT comments  Pt with good participation today. Ambulated to bathroom @ RW level with S. Min A for LB ADL. Pt will benefit form rehab at SNF to return to PLOF. Will continue  to follow acutely to facilitae sfae DC to next venue.   Follow Up Recommendations  SNF;Supervision/Assistance - 24 hour    Equipment Recommendations  3 in 1 bedside commode    Recommendations for Other Services      Precautions / Restrictions Precautions Precautions: Fall Precaution Comments: c/o L knee pain Restrictions Weight Bearing Restrictions: No       Mobility Bed Mobility               General bed mobility comments: Pt OOB in chair  Transfers Overall transfer level: Needs assistance Equipment used: Rolling walker (2 wheeled) Transfers: Sit to/from Stand Sit to Stand: Supervision         General transfer comment: Min guard for safety, multi modal cues for hand placement    Balance Overall balance assessment: Needs assistance Sitting-balance support: Feet supported;No upper extremity supported Sitting balance-Leahy Scale: Good       Standing balance-Leahy Scale: Fair                     ADL Overall ADL's : Needs assistance/impaired     Grooming: Set up;Standing       Lower Body Bathing: Minimal assistance;Sit to/from stand       Lower Body Dressing: Minimal assistance;Sit to/from stand Lower Body Dressing Details (indicate cue type and reason): would benefit from AE Toilet Transfer: Supervision/safety;RW;BSC;Grab bars (BSC over toilet)   Toileting- Clothing Manipulation and Hygiene: Set up;Sitting/lateral lean       Functional mobility during ADLs: Supervision/safety;Rolling walker;Cueing  for safety   Fatigues with minimal activity     Vision                     Perception     Praxis      Cognition   Behavior During Therapy: WFL for tasks assessed/performed Overall Cognitive Status: Within Functional Limits for tasks assessed                       Extremity/Trunk Assessment     BUE generalzied weakness States knee feels better          Exercises     Shoulder Instructions       General Comments      Pertinent Vitals/ Pain       Pain Assessment: 0-10 Pain Score: 4  Pain Location: L knee Pain Descriptors / Indicators: Sore;Aching Pain Intervention(s): Limited activity within patient's tolerance  Home Living                                          Prior Functioning/Environment              Frequency  Min 2X/week        Progress Toward Goals  OT Goals(current goals can now be found in the care plan section)  Progress towards OT goals: Progressing toward goals  Acute Rehab  OT Goals Patient Stated Goal: return home OT Goal Formulation: With patient Time For Goal Achievement: 08/28/16 Potential to Achieve Goals: Good ADL Goals Pt Will Perform Grooming: with supervision;standing Pt Will Transfer to Toilet: with supervision;ambulating;bedside commode Pt Will Perform Toileting - Clothing Manipulation and hygiene: with supervision;sit to/from stand Pt Will Perform Tub/Shower Transfer: Shower transfer;with min guard assist;ambulating;3 in 1 Additional ADL Goal #1: pt will initiate rest breaks for energy conservation and demonstrate pursed lip breathing when he feels SOB  Plan Discharge plan needs to be updated    Co-evaluation                 End of Session Equipment Utilized During Treatment: Rolling walker   Activity Tolerance Patient tolerated treatment well   Patient Left in chair;with call bell/phone within reach;with family/visitor present;Other (comment) (with PT)   Nurse  Communication Mobility status        Time: [id number] OT Time Calculation (min): 13 min  Charges: OT General Charges $OT Visit: 1 Procedure OT Treatments $Self Care/Home Management : 8-22 mins  Shane Mathews,Shane Mathews 08/19/2016, 4:44 PM   [facility] Mathews, OT/L  [phone] 08/19/2016

## 2016-08-19 NOTE — Progress Notes (Signed)
Physical Therapy Treatment Patient Details Name: Shane Mathews MRN: [id number] DOB: 04-16-1941 Today's Date: 08/19/2016    History of Present Illness Pt is a 75 y/o male admitted with acute DOE/SOB/pleural effusion.  PMH:  AFib, HF, HTN, venous insufficiency    PT Comments    Patient seen for activity progression. Dove tail session with OT therapist to accommodate patient's activity tolerance. Ambulate patient in hall with use of RW. Patient tolerated well but reports 2/4 DOE and contiued LLE knee pain with activity. Encouraged continued ROM for LLE and elevation for edema control. Will continue to see and progress as tolerated.  Follow Up Recommendations  SNF;Supervision/Assistance - 24 hour     Equipment Recommendations  None recommended by PT    Recommendations for Other Services       Precautions / Restrictions Precautions Precautions: Fall Precaution Comments: c/o L knee pain Restrictions Weight Bearing Restrictions: No    Mobility  Bed Mobility               General bed mobility comments: patient received in chair  Transfers Overall transfer level: Needs assistance Equipment used: Rolling walker (2 wheeled) Transfers: Sit to/from Stand Sit to Stand: Min guard         General transfer comment: Min guard for safety, multi modal cues for hand placement  Ambulation/Gait Ambulation/Gait assistance: Min guard Ambulation Distance (Feet): 70 Feet Assistive device: Rolling walker (2 wheeled) Gait Pattern/deviations: Step-through pattern;Antalgic;Trunk flexed;Wide base of support Gait velocity: decreased Gait velocity interpretation: Below normal speed for age/gender General Gait Details: patient very slow and guarded during ambulation, 3 standing rest breaks with cues for upright posture and encouragement for mobility. Patient reports DOE 2/4 but saturations and HR stable   Stairs            Wheelchair Mobility    Modified Rankin (Stroke  Patients Only)       Balance Overall balance assessment: Needs assistance Sitting-balance support: Feet supported;No upper extremity supported Sitting balance-Leahy Scale: Good                              Cognition Arousal/Alertness: Awake/alert Behavior During Therapy: WFL for tasks assessed/performed Overall Cognitive Status: Within Functional Limits for tasks assessed                      Exercises      General Comments General comments (skin integrity, edema, etc.): educated on ROM activities for LLE (knee) to improve pain tolerance      Pertinent Vitals/Pain Pain Assessment: 0-10 Pain Score: 4  Pain Location: L knee Pain Descriptors / Indicators: Sore;Aching Pain Intervention(s): Monitored during session    Home Living                      Prior Function            PT Goals (current goals can now be found in the care plan section) Acute Rehab PT Goals Patient Stated Goal: return home PT Goal Formulation: With patient Time For Goal Achievement: 08/30/16 Potential to Achieve Goals: Fair Progress towards PT goals: Progressing toward goals    Frequency    Min 3X/week      PT Plan Current plan remains appropriate    Co-evaluation             End of Session   Activity Tolerance: Patient limited by pain;Treatment limited secondary to agitation  Patient left: in chair;with call bell/phone within reach;with family/visitor present     Time: 1440-1457 PT Time Calculation (min) (ACUTE ONLY): 17 min  Charges:  $Gait Training: 8-22 mins                    G Codes:      Shane Mathews 08/19/2016, 4:39 PM Shane Mathews, PT DPT  [phone]

## 2016-08-19 NOTE — Progress Notes (Signed)
Patient Name: Shane Mathews [facility] Date of Encounter: 08/19/2016  Primary Cardiologist: Dr. Williemae Area Problem List     Principal Problem:   Pleural effusion Active Problems:   Dyspnea   Persistent atrial fibrillation (HCC)   Edema   Hyperglycemia   Hypokalemia   Absent pulse in lower extremity   Bradycardia   Hypertensive heart disease   Acute on chronic diastolic heart failure (HCC)   Resistant hypertension   PAD (peripheral artery disease) (HCC)   Abnormal thyroid function test     Subjective   Feeling better.  Has knee pain  Inpatient Medications    Scheduled Meds: . amLODipine  10 mg Oral Daily  . apixaban  5 mg Oral BID  . atorvastatin  40 mg Oral q1800  . carvedilol  6.25 mg Oral BID WC  . colchicine  0.6 mg Oral Daily  . enalapril  20 mg Oral BID  . febuxostat  40 mg Oral Daily  . furosemide  60 mg Intravenous BID  . hydrALAZINE  100 mg Oral Q8H  . LORazepam  0.5 mg Oral BID  . pantoprazole  20 mg Oral Daily  . PARoxetine  20 mg Oral Daily  . predniSONE  30 mg Oral Q breakfast  . spironolactone  100 mg Oral Daily  . terazosin  20 mg Oral QHS  . triamcinolone ointment   Topical BID   Continuous Infusions:  PRN Meds: benzonatate, guaiFENesin-dextromethorphan, LORazepam, traMADol   Vital Signs    Vitals:   08/18/16 1133 08/18/16 1502 08/18/16 2102 08/19/16 0557  BP: (!) 140/46 (!) 157/42 (!) 161/53 (!) 149/50  Pulse: 71  64 64  Resp: 16  17 18   Temp: 99 F (37.2 C)  97.6 F (36.4 C) 97.2 F (36.2 C)  TempSrc: Oral  Oral Oral  SpO2: 97%  97% 94%  Weight:    279 lb 3.2 oz (126.6 kg)  Height:        Intake/Output Summary (Last 24 hours) at 08/19/16 0936 Last data filed at 08/19/16 0845  Gross per 24 hour  Intake              720 ml  Output             2395 ml  Net            -1675 ml   Filed Weights   08/17/16 0516 08/18/16 0535 08/19/16 0557  Weight: 284 lb 14.4 oz (129.2 kg) 284 lb 8 oz (129 kg) 279 lb 3.2 oz (126.6 kg)     Physical Exam    GEN: Well nourished,  in no acute distress.sitting up in chair  HEENT: normocephalic, sclera clear, mucus membranes moist.  Neck: Supple, no JVD,  or masses. Cardiac:irreg irreg, no murmurs, rubs, or gallops. No clubbing, cyanosis, 2+ edema LE bil..  Radials 2+ and equal bilaterally.  Respiratory:  Respirations regular and unlabored, bil breath sounds auscultated bilaterally without rales, rhonchi or wheezes. GI: Abd -Soft, nontender, nondistended, BS + x 4. MS: no deformity or atrophy. + lt knee pain Skin: warm and dry, brisk capillary refill, no obvious rash Neuro:  Alert and oriented X 3 MAE, follows commands Psych: answers questions appropriately,Normal and pleasant affect.   Labs    CBC  Recent Labs  08/17/16 0857 08/18/16 0523  WBC 9.6 9.5  HGB 10.9* 11.0*  HCT 34.1* 34.2*  MCV 74.8* 74.0*  PLT 219 212   Basic Metabolic Panel  Recent Labs  08/18/16  [id number] 08/19/16 0407  NA 133* 132*  K 3.7 3.9  CL 94* 93*  CO2 29 29  GLUCOSE 130* 169*  BUN 17 22*  CREATININE 1.03 1.13  CALCIUM 8.5* 8.6*   Liver Function Tests No results for input(s): AST, ALT, ALKPHOS, BILITOT, PROT, ALBUMIN in the last 72 hours. No results for input(s): LIPASE, AMYLASE in the last 72 hours. Cardiac Enzymes No results for input(s): CKTOTAL, CKMB, CKMBINDEX, TROPONINI in the last 72 hours. BNP Invalid input(s): POCBNP D-Dimer No results for input(s): DDIMER in the last 72 hours. Hemoglobin A1C No results for input(s): HGBA1C in the last 72 hours. Fasting Lipid Panel No results for input(s): CHOL, HDL, LDLCALC, TRIG, CHOLHDL, LDLDIRECT in the last 72 hours. Thyroid Function Tests No results for input(s): TSH, T4TOTAL, T3FREE, THYROIDAB in the last 72 hours.  Invalid input(s): FREET3  Telemetry    A fib with rate in 50s occ pauses, also 9 beats of WCT - Personally Reviewed  ECG    No new since 08/12/16 - Personally Reviewed  Radiology    No results  found.  Cardiac Studies     Patient Profile        39M with hypertension, hyperlipidemia, OSA, obesity, venous insufficiency, anxiety, claudication (prior duplex ordered and never performed - this adm with abnormal ABIs), RBBB and GERD here with newly diagnosed atrial fibrillation with slow ventricular response and acute diastolic heart failure. 2D Echo 08/13/16: moderate LVH, EF 60-65%, mild MR, severely dilated LA. ABIs 08/13/16: 0.71 right, 0.69 left.   Assessment & Plan    1. Acute diastolic CHF - continues to diurese well on present regimen. He still appears volume overloaded. Continue IV lasix and spironolactone. He is down 11.7L and 28lbs. Dry weight not really known. Will consult dietician to reinforce HF diet education.  2. Persistent atrial fib - unknown duration. CHADSVASC 5. Now on apixaban. Do not see prior mention of plans for rhythm control, but may be reasonable to plan for 3 weeks of uninterrupted anticoagulation with outpatient DCCV. --HR to 30s, coreg decreased.   3. Intermittent bradycardia - HR occasionally drops to the upper 30s, low 40s even during day hours. Decreased beta blocker dose. Given patient's decreased functional status per PT reports, do not want HR to fall too low too often.  4. Hypertension, seemingly resistant - being worked up by internal med for secondary causes. Renal artery duplex was non-diagnostic study due to patient body habitus, bowel gas, and patient positioning. May need to consider CTA to assess. BP with some improvement.   5. Abnormal free T4 - subclinical hyperthyroidism has been implicated in increased risk of afib but this is moreso in patients with TSH <0.1. This will need attention in follow-up by primary team.  6. PAD by abnl ABIs - asymptomatic. Follow clinically.  7. ?OSA - listed in H/p, patient does not have CPAP at home. Recommend this be evaluated at dc as it can contribute to his AF.  8. WCT possible NSVT though with a  fib could be aberrancy   9.  Knee pain per IM  Signed, Nada BoozerLaura Ingold, NP  08/19/2016, 9:36 AM   [facility] Pager [phone]  After 5 or weekends [phone] Patient seen and examined. I agree with the assessment and plan as detailed above. See also my additional thoughts below.   I had a careful discussion with the patient today about salt and fluid intake. He does drink excessive amounts of water. He seems to understand  that he needs to limit both salt and fluid intake.  Willa RoughJeffrey Katz, MD, [facility] 08/19/2016 10:43 AM

## 2016-08-19 NOTE — Care Management Important Message (Signed)
Important Message  Patient Details  Name: Shane Mathews H Currier MRN: [id number] Date of Birth: [birth year]   Medicare Important Message Given:  Yes    Scarlettrose Costilow Stefan ChurchBratton 08/19/2016, 12:18 PM

## 2016-08-19 NOTE — Progress Notes (Signed)
Family Medicine Teaching Service Daily Progress Note Intern Pager: [phone]  Patient name: Shane Mathews Medical record number: [id number] Date of birth: 05-07-1941 Age: 75 y.o. Gender: male  Primary Care Provider: Sanjuana LettersHENSEL,WILLIAM ARTHUR, MD Consultants: None Code Status: Full  Pt Overview and Major Events to Date:  1. Admit to FMTS 08/12/16  Assessment and Plan: Shane Mathews is a 75 y.o. male presenting with exertional SOB . PMH is significant for HTN, chronic venous insufficiency, Anxiety, GERD, Claudication, Hyperlipidemia  Dyspnea on Exertion, improving:  Weight stable from yesterday, total -28 pounds since admit. Lung exam continuing to improve with fewer crackles today.  Continue to diurese with IV lasix, per Cardiology. - Strict I's and O's - Daily weights - Continue 60 mg IV Lasix BID - Cardiology following - appreciate recs  # Intermittent bradycardia: Asymptomatic. Stable overnight.  - cont coreg to 6.25mg  BID - cont to monitor.   New onset afb:  Consider 3 weeks uninterrupted anticoagulation with outpatient DCCV in 3 weeks  - Cardiology consulted; appreciate recommendations - Apixaban 5mg  BID - Continue to monitor SOB - possible DCCV in 3 weeks outpatient.   Hypertension:  24hr urine metanepherines and cortisol pending. BP remains elevated up to 149/50.  Given low diastolic pressure, decreased terazosin to 10mg  daily and spironolactone to 50mg  daily.  - Continue norvasc 10mg  daily, enalapril 20mg  BID and hydralazine 100mg  TID, terazosin 10mg  daily, spironolactone to 50mg , coreg 6.25mg  BID - F/u 24hr cortisol and VMA - Consider additional renal imaging after discharge  Cough, stable:  - Continue to monitor - Tessalon Perles 100mg  TID PRN - Consider albuterol PRN and PFTs  Anemia, stable: Hemoglobin of 11.2 noted on admission. MCV 73. Due for colonoscopy. FOBT negative in 08/2015. Hgb 10.9 this AM.  - Daily CBC - Consider Iron studies if no  improvement  History of gout:  Patient complaining of acute L knee pain that feels similar to prior gout flares.  Much improved this morning. Will continue to cncourage PT/OT.  -cont 30mg  prednisone daily and for another 2 days after resolution of symptoms - started colchicine 0.6mg  daily - Cont febuxostat 40mg  qd  Hypokalemia, improving: 2.8 upon presentation. Normalized at 3.9 this AM.  - F/u BMP - cont spironolactone to 50mg  daily  Panic Attacks, stable: Last documentation and office visit was in 2014. Currently only on Paxil 20 mg daily.   - Continue Paxil 20 mg qd - Ativan 0.5mg  BID q4hrs PRN  Hypertriglyceridemia: Lipid panel showed LDL 36, otherwise normal. - Continue lipitor 40mg  qd  GERD:  - Cont home Protonix 20mg  qd  Intermittent Claudication:  Reported claudication in bilateral lower extremities in the past. ABIs show moderate arterial disease (ABI: R: 0.71, L: 0.69). Patient denies current symptoms.  - Continue lipitor 40mg  daily - Consider TED hose - Consider vascular f/u outpatient  FEN/GI: Heart healthy diet/ protonix Prophylaxis: subQ heparin  Disposition: discharge home when stable  Subjective:  Patient continues to note improvements in his breathing and SOB.  Pain in L knee and ankle improved with prednisone. Will try PT/OT today.  Wants to go to SNF after DC.   Objective: Temp:  [97.2 F (36.2 C)-99 F (37.2 C)] 97.2 F (36.2 C) (12/12 0557) Pulse Rate:  [64-71] 64 (12/12 0557) Resp:  [16-18] 18 (12/12 0557) BP: (140-161)/(42-62) 149/50 (12/12 0557) SpO2:  [93 %-97 %] 94 % (12/12 0557) Weight:  [279 lb 3.2 oz (126.6 kg)] 279 lb 3.2 oz (126.6 kg) (12/12 0557) Physical Exam:  General: very pleasant 75 year old male sitting up in bed in NAD ENTM: MMM Cardiovascular: RRR, no murmurs appreciated Respiratory: NWOB on RA, CTAB, crackles at bases Gastrointestinal: soft, NTND, +BS MSK: Moves all extremities spontaneously, improving pain in L knee and  L ankle Extremities: 2+ pitting edema in lower extremities up to shins bilaterally.  Derm: Chronic venous stasis changes seen on bilateral lower extremities  Neuro: A&Ox3 Psych: Appropriate mood and affect  Laboratory:  Recent Labs Lab 08/15/16 0517 08/17/16 0857 08/18/16 0523  WBC 10.3 9.6 9.5  HGB 11.7* 10.9* 11.0*  HCT 36.2* 34.1* 34.2*  PLT 215 219 212    Recent Labs Lab 08/12/16 1501  08/17/16 0158 08/18/16 0523 08/19/16 0407  NA 134*  < > 132* 133* 132*  K 2.8*  < > 3.5 3.7 3.9  CL 96*  < > 92* 94* 93*  CO2 33*  < > 29 29 29   BUN 10  < > 18 17 22*  CREATININE 0.94  0.91  < > 1.10 1.03 1.13  CALCIUM 8.3*  < > 8.2* 8.5* 8.6*  PROT 5.8*  --   --   --   --   BILITOT 0.7  --   --   --   --   ALKPHOS 77  --   --   --   --   ALT 14*  --   --   --   --   AST 16  --   --   --   --   GLUCOSE 148*  < > 115* 130* 169*  < > = values in this interval not displayed.  Imaging/Diagnostic Tests: No results found.  Renne Muscaaniel L Taiyana Kissler, MD 08/19/2016, 6:53 AM PGY-1, [facility] FPTS Intern pager: [phone], text pages welcome

## 2016-08-19 NOTE — Clinical Social Work Note (Signed)
CSW met with patient. No supports at bedside. CSW presented bed offers. Patient will review with his wife and make a decision. CSW emphasized the importance of working with therapies so insurance authorization can be obtained. Patient expressed understanding and said they were coming by at 2:30 pm today.  PASARR obtained: [phone] [place], [place]

## 2016-08-20 LAB — BASIC METABOLIC PANEL
ANION GAP: 9 (ref 5–15)
BUN: 30 mg/dL — AB (ref 6–20)
CALCIUM: 8.8 mg/dL — AB (ref 8.9–10.3)
CO2: 31 mmol/L (ref 22–32)
Chloride: 97 mmol/L — ABNORMAL LOW (ref 101–111)
Creatinine, Ser: 1.13 mg/dL (ref 0.61–1.24)
GFR calc Af Amer: >60 mL/min (ref >60)
GLUCOSE: 140 mg/dL — AB (ref 65–99)
Potassium: 3.7 mmol/L (ref 3.5–5.1)
Sodium: 137 mmol/L (ref 135–145)

## 2016-08-20 MED ORDER — FUROSEMIDE 40 MG PO TABS
40.0000 mg | ORAL_TABLET | Freq: Every day | ORAL | Status: DC
  Administered 2016-08-20 – 2016-08-21 (×2): 40 mg via ORAL
  Filled 2016-08-20 (×2): qty 1

## 2016-08-20 MED ORDER — PREDNISONE 20 MG PO TABS
30.0000 mg | ORAL_TABLET | Freq: Every day | ORAL | Status: DC
  Administered 2016-08-20 – 2016-08-21 (×2): 30 mg via ORAL
  Filled 2016-08-20 (×2): qty 1

## 2016-08-20 MED ORDER — HYDROCERIN EX CREA
TOPICAL_CREAM | Freq: Two times a day (BID) | CUTANEOUS | Status: DC
  Administered 2016-08-20 – 2016-08-21 (×3): via TOPICAL
  Filled 2016-08-20: qty 113

## 2016-08-20 NOTE — Progress Notes (Signed)
HR continues to drop into the high 30s/low 40s this AM. Pt also with short pauses. Will continue to monitor.

## 2016-08-20 NOTE — Progress Notes (Signed)
Family Medicine Teaching Service Daily Progress Note Intern Pager: [phone]  [facility] record number: [id number] Date of birth: [birth year] Age: 75 y.o. Gender: male  Primary Care Provider: Sanjuana LettersHENSEL,WILLIAM ARTHUR, MD Consultants: None Code Status: Full  Pt Overview and Major Events to Date:  08/12/16 - Admit to FMTS  Assessment and Plan: Shane Mathews is a 75 y.o. male presenting with exertional SOB . PMH is significant for HTN, chronic venous insufficiency, Anxiety, GERD, Claudication, Hyperlipidemia  CHF exacerbation, improving:  Weight stable from yesterday, total -28 pounds since admit. Continue to diurese with PO lasix, per Cardiology. UOP 1L yesterday. - Strict I's and O's - Daily weights - lasix 40mg  QD PO - Cardiology following - appreciate recs: oral lasix today  Intermittent bradycardia: Asymptomatic. Stable overnight.  - cont coreg to 6.25mg  BID - cont to monitor.   New onset afb:  Consider 3 weeks uninterrupted anticoagulation with outpatient DCCV in 3 weeks  - Cardiology consulted; appreciate recommendations - Apixaban 5mg  BID - Continue to monitor SOB - possible DCCV in 3 weeks outpatient.   Hypertension:  24hr urine metanepherines and cortisol pending. BP remains elevated up to 149/50.  Given low diastolic pressure, decreased terazosin to 10mg  daily and spironolactone to 50mg  daily on 12/11.  - Continue norvasc 10mg  daily, enalapril 20mg  BID and hydralazine 100mg  TID, terazosin 10mg  daily, spironolactone to 50mg , coreg 6.25mg  BID - F/u 24hr cortisol and VMA - Consider additional renal imaging after discharge  Cough, stable:  - Continue to monitor - Tessalon 100mg  TID PRN - Consider albuterol PRN and PFTs  Anemia, stable: Hemoglobin of 11.2 noted on admission. MCV 73. Due for colonoscopy. FOBT negative in 08/2015.  - Daily CBC - Consider Iron studies if no improvement  History of gout:  Patient complaining of acute L knee pain  that feels similar to prior gout flares.  Much improved this morning. Will continue to cncourage PT/OT.  -cont 30mg  prednisone daily and for another 2 days after resolution of symptoms - started colchicine 0.6mg  daily on 12/11 - Cont febuxostat 40mg  qd  Hypokalemia, resolved. - F/u BMP - cont spironolactone to 50mg  daily  Panic Attacks, stable: Last documentation and office visit was in 2014. Currently only on Paxil 20 mg daily.   - Continue Paxil 20 mg qd - Ativan 0.5mg  BID q4hrs PRN  Hypertriglyceridemia: Lipid panel showed LDL 36, otherwise normal. - Continue lipitor 40mg  qd  GERD:  - Cont home Protonix 20mg  qd  Intermittent Claudication:  Reported claudication in bilateral lower extremities in the past. ABIs show moderate arterial disease (ABI: R: 0.71, L: 0.69). Patient denies current symptoms.  - Continue lipitor 40mg  daily - Consider TED hose - Consider vascular f/u outpatient  FEN/GI: Heart healthy diet/ protonix Prophylaxis: subQ heparin  Disposition: discharge home when stable  Subjective:  Patient feels great this morning, concerned with flaky skin on legs and ice on knee.   Objective: Temp:  [97.6 F (36.4 C)-98.9 F (37.2 C)] 97.8 F (36.6 C) (12/13 0508) Pulse Rate:  [57-67] 62 (12/13 0508) Resp:  [16-20] 16 (12/13 0508) BP: (140-172)/(40-67) 171/56 (12/13 0508) SpO2:  [97 %-98 %] 98 % (12/13 0508) Weight:  [279 lb 3.2 oz (126.6 kg)] 279 lb 3.2 oz (126.6 kg) (12/13 14780508) Physical Exam: General: very pleasant 75 year old male sitting up in bed in NAD ENTM: MMM Cardiovascular: RRR, no murmurs appreciated Respiratory: NWOB on RA, CTAB, crackles at bases Gastrointestinal: soft, NTND, +BS MSK: Moves all extremities spontaneously,  improving pain in L knee and L ankle Extremities: 2+ pitting edema in lower extremities up to shins bilaterally.  Derm: Chronic venous stasis changes seen on bilateral lower extremities with flaking skin bilaterally Neuro:  A&Ox3 Psych: Appropriate mood and affect  Laboratory:  Recent Labs Lab 08/15/16 0517 08/17/16 0857 08/18/16 0523  WBC 10.3 9.6 9.5  HGB 11.7* 10.9* 11.0*  HCT 36.2* 34.1* 34.2*  PLT 215 219 212    Recent Labs Lab 08/18/16 0523 08/19/16 0407 08/20/16 0404  NA 133* 132* 137  K 3.7 3.9 3.7  CL 94* 93* 97*  CO2 29 29 31   BUN 17 22* 30*  CREATININE 1.03 1.13 1.13  CALCIUM 8.5* 8.6* 8.8*  GLUCOSE 130* 169* 140*    Imaging/Diagnostic Tests: No results found.  Shane BignessKathryn Lisle Skillman, MD 08/20/2016, 7:25 AM PGY-1, [facility] FPTS Intern pager: [phone], text pages welcome

## 2016-08-20 NOTE — Progress Notes (Signed)
Physical Therapy Treatment Patient Details Name: Shane Mathews H Delia MRN: [id number] DOB: 1940/12/15 Today's Date: 08/20/2016    History of Present Illness Pt is a 75 y/o male admitted with acute DOE/SOB/pleural effusion.  PMH:  AFib, HF, HTN, venous insufficiency    PT Comments    Pt presented supine in bed with HOB very elevated, awake and with max encouragement was willing to participate in therapy session. Pt making slow progress with mobility. He reported that his breathing felt better this session, with SPO2 maintaining >95% throughout. Pt's HR also maintained WNL. Pt would continue to benefit from skilled physical therapy services at this time while admitted and after d/c to address his limitations in order to improve his overall safety and independence with functional mobility.   Follow Up Recommendations  SNF;Supervision/Assistance - 24 hour     Equipment Recommendations  None recommended by PT    Recommendations for Other Services       Precautions / Restrictions Precautions Precautions: Fall Precaution Comments: c/o L knee pain Restrictions Weight Bearing Restrictions: No    Mobility  Bed Mobility Overal bed mobility: Needs Assistance Bed Mobility: Supine to Sit;Sit to Supine     Supine to sit: HOB elevated;Supervision Sit to supine: Supervision   General bed mobility comments: pt required increased time, supervision for safety  Transfers Overall transfer level: Needs assistance Equipment used: Rolling walker (2 wheeled) Transfers: Sit to/from Stand Sit to Stand: Supervision         General transfer comment: min guard for safety, pt demonstrated good technique  Ambulation/Gait Ambulation/Gait assistance: Min guard Ambulation Distance (Feet): 50 Feet Assistive device: Rolling walker (2 wheeled) Gait Pattern/deviations: Step-through pattern;Decreased step length - right;Decreased step length - left;Decreased stride length;Trunk flexed Gait velocity:  decreased Gait velocity interpretation: Below normal speed for age/gender General Gait Details: pt with improved gait this session and no c/o of pain. pt ambulated on RA with SPO2 maintaining >95% throughout. PT's HR maintained WNL   Stairs            Wheelchair Mobility    Modified Rankin (Stroke Patients Only)       Balance Overall balance assessment: Needs assistance Sitting-balance support: Feet supported;No upper extremity supported Sitting balance-Leahy Scale: Good     Standing balance support: During functional activity;No upper extremity supported Standing balance-Leahy Scale: Fair                      Cognition Arousal/Alertness: Awake/alert Behavior During Therapy: WFL for tasks assessed/performed Overall Cognitive Status: Within Functional Limits for tasks assessed                      Exercises      General Comments        Pertinent Vitals/Pain Pain Assessment: Faces Faces Pain Scale: No hurt Pain Intervention(s): Monitored during session    Home Living                      Prior Function            PT Goals (current goals can now be found in the care plan section) Acute Rehab PT Goals Patient Stated Goal: return home PT Goal Formulation: With patient Time For Goal Achievement: 08/30/16 Potential to Achieve Goals: Fair Progress towards PT goals: Progressing toward goals    Frequency    Min 3X/week      PT Plan Current plan remains appropriate    Co-evaluation  End of Session Equipment Utilized During Treatment: Gait belt Activity Tolerance: Patient tolerated treatment well Patient left: in bed;with call bell/phone within reach     Time: 1155-1217 PT Time Calculation (min) (ACUTE ONLY): 22 min  Charges:  $Gait Training: 8-22 mins                    G CodesAlessandra Bevels:      Nya Monds M Treyana Sturgell 08/20/2016, 12:31 PM Deborah ChalkJennifer Daud Cayer, PT, DPT [phone]

## 2016-08-20 NOTE — Progress Notes (Signed)
[facility] Date of Encounter: 08/20/2016  Primary Cardiologist:   Hospital Problem List     Principal Problem:   Pleural effusion Active Problems:   Dyspnea   Persistent atrial fibrillation (HCC)   Edema   Hyperglycemia   Hypokalemia   Absent pulse in lower extremity   Bradycardia   Hypertensive heart disease   Acute on chronic diastolic heart failure (HCC)   Resistant hypertension   PAD (peripheral artery disease) (HCC)   Abnormal thyroid function test   Aspiration pneumonia (HCC)   Adrenal insufficiency (HCC)     Subjective   The patient continues to feel better. He has chronic skin changes on his lower extremity but most of his edema is gone. He listened very carefully to me yesterday about limiting his salt and fluid intake. He is very committed to this.  Inpatient Medications    Scheduled Meds: . amLODipine  10 mg Oral Daily  . apixaban  5 mg Oral BID  . atorvastatin  40 mg Oral q1800  . carvedilol  6.25 mg Oral BID WC  . colchicine  0.6 mg Oral Daily  . enalapril  20 mg Oral BID  . febuxostat  40 mg Oral Daily  . furosemide  60 mg Intravenous BID  . hydrALAZINE  100 mg Oral Q8H  . hydrocerin   Topical BID  . LORazepam  0.5 mg Oral BID  . pantoprazole  20 mg Oral Daily  . PARoxetine  20 mg Oral Daily  . predniSONE  30 mg Oral Q breakfast  . spironolactone  50 mg Oral Daily  . terazosin  10 mg Oral QHS  . triamcinolone ointment   Topical BID   Continuous Infusions:  PRN Meds: benzonatate, guaiFENesin-dextromethorphan, LORazepam, traMADol   Vital Signs    Vitals:   08/19/16 1109 08/19/16 1608 08/19/16 2013 08/20/16 0508  BP: (!) 154/67 (!) 172/51 (!) 140/40 (!) 171/56  Pulse: 66 67 (!) 57 62  Resp: 18 20 18 16   Temp: 98.9 F (37.2 C) 97.6 F (36.4 C) 98 F (36.7 C) 97.8 F (36.6 C)  TempSrc: Oral Oral Oral Oral  SpO2: 97% 98% 98% 98%  Weight:    279 lb 3.2 oz (126.6 kg)  Height:        Intake/Output Summary (Last 24  hours) at 08/20/16 1141 Last data filed at 08/20/16 0900  Gross per 24 hour  Intake             1080 ml  Output             1376 ml  Net             -296 ml   Filed Weights   08/18/16 0535 08/19/16 0557 08/20/16 0508  Weight: 284 lb 8 oz (129 kg) 279 lb 3.2 oz (126.6 kg) 279 lb 3.2 oz (126.6 kg)    Physical Exam  The patient is oriented to person time and place. Affect is normal. Lungs reveal scattered rhonchi. Cardiac exam reveals an S1 and S2. The rhythm is irregularly irregular. The abdomen is soft. Peripheral edema is markedly reduced. He has significant chronic skin changes on his lower extremities.  Labs    CBC  Recent Labs  08/18/16 0523  WBC 9.5  HGB 11.0*  HCT 34.2*  MCV 74.0*  PLT 212   Basic Metabolic Panel  Recent Labs  08/19/16 0407 08/20/16 0404  NA 132* 137  K 3.9 3.7  CL 93* 97*  CO2 29 31  GLUCOSE 169* 140*  BUN 22* 30*  CREATININE 1.13 1.13  CALCIUM 8.6* 8.8*   Liver Function Tests No results for input(s): AST, ALT, ALKPHOS, BILITOT, PROT, ALBUMIN in the last 72 hours. No results for input(s): LIPASE, AMYLASE in the last 72 hours. Cardiac Enzymes No results for input(s): CKTOTAL, CKMB, CKMBINDEX, TROPONINI in the last 72 hours. BNP Invalid input(s): POCBNP D-Dimer No results for input(s): DDIMER in the last 72 hours. Hemoglobin A1C No results for input(s): HGBA1C in the last 72 hours. Fasting Lipid Panel No results for input(s): CHOL, HDL, LDLCALC, TRIG, CHOLHDL, LDLDIRECT in the last 72 hours. Thyroid Function Tests No results for input(s): TSH, T4TOTAL, T3FREE, THYROIDAB in the last 72 hours.  Invalid input(s): FREET3  Telemetry     - Personally Reviewed     He has continued atrial fibrillation. There are wide complex beats that may be aberrant atrial fib. At times his atrial fib rate is slow. He is not symptomatic with this.  ECG      Radiology    No results found.  Cardiac Studies     Patient Profile     The patient  is here with atrial fibrillation and significant CHF/volume overload. He is diuresed nicely.  Assessment & Plan   1. Acute diastolic CHF - He continues to improve nicely. I'm switching him to oral Lasix today.  2. Persistent atrial fib - unknown duration. CHADSVASC 5. Now on apixaban. His rate is relatively slow. There is question of whether we should consider cardioversion after 3 weeks of anticoagulation. For now we will continue the current therapy.  3. Intermittent bradycardia - HR occasionally drops to the upper 30s, low 40s even during day hours. Decreased beta blocker dose. Given patient's decreased functional status per PT reports, do not want HR to fall too low too often.  4. Hypertension, seemingly resistant - being worked up by internal med for secondary causes. Renal artery duplex was non-diagnostic study due to patient body habitus, bowel gas, and patient positioning. May need to consider CTA to assess. BP with some improvement. I've chosen not to change his medications today.  5. Abnormal free T4 - subclinical hyperthyroidism has been implicated in increased risk of afib but this is moreso in patients with TSH <0.1. This will need attention in follow-up by primary team.  6. PAD by abnl ABIs - asymptomatic. Follow clinically.  7. ?OSA - listed in Mathews/p, patient does not have CPAP at home. Recommend this be evaluated at dc as it can contribute to his AF.  8. WCT possible NSVT though with a fib could be aberrancy. He has good left trickle of function. No further workup of these wide complex beats.  9.  Knee pain per IM. The patient has been placed on prednisone for several days. Internal medicine is following this carefully.  It is my understanding from the patient that he will go to rehabilitation from the hospital. I do not know if this has been arranged. Fortunately he is stabilizing and may be ready for discharge in the next few days.   Signed, Willa RoughJeffrey Sheritta Deeg, MD    08/20/2016, 11:41 AM

## 2016-08-21 ENCOUNTER — Inpatient Hospital Stay: Payer: Medicare Other | Admitting: Family Medicine

## 2016-08-21 DIAGNOSIS — J918 Pleural effusion in other conditions classified elsewhere: Secondary | ICD-10-CM

## 2016-08-21 DIAGNOSIS — M109 Gout, unspecified: Secondary | ICD-10-CM | POA: Diagnosis not present

## 2016-08-21 DIAGNOSIS — J189 Pneumonia, unspecified organism: Secondary | ICD-10-CM | POA: Insufficient documentation

## 2016-08-21 DIAGNOSIS — M1000 Idiopathic gout, unspecified site: Secondary | ICD-10-CM | POA: Diagnosis not present

## 2016-08-21 LAB — MISC LABCORP TEST (SEND OUT)
LABCORP TEST CODE: 120253
LABCORP TEST CODE: 4143

## 2016-08-21 LAB — CBC
HCT: 35.7 % — ABNORMAL LOW (ref 39.0–52.0)
HEMOGLOBIN: 11 g/dL — AB (ref 13.0–17.0)
MCH: 23.2 pg — AB (ref 26.0–34.0)
MCHC: 30.8 g/dL (ref 30.0–36.0)
MCV: 75.3 fL — ABNORMAL LOW (ref 78.0–100.0)
PLATELETS: 265 10*3/uL (ref 150–400)
RBC: 4.74 MIL/uL (ref 4.22–5.81)
RDW: 15.4 % (ref 11.5–15.5)
WBC: 8.3 10*3/uL (ref 4.0–10.5)

## 2016-08-21 LAB — BASIC METABOLIC PANEL
Anion gap: 9 (ref 5–15)
BUN: 30 mg/dL — AB (ref 6–20)
CO2: 33 mmol/L — ABNORMAL HIGH (ref 22–32)
Calcium: 8.7 mg/dL — ABNORMAL LOW (ref 8.9–10.3)
Chloride: 98 mmol/L — ABNORMAL LOW (ref 101–111)
Creatinine, Ser: 1.12 mg/dL (ref 0.61–1.24)
Glucose, Bld: 118 mg/dL — ABNORMAL HIGH (ref 65–99)
POTASSIUM: 3.7 mmol/L (ref 3.5–5.1)
SODIUM: 140 mmol/L (ref 135–145)

## 2016-08-21 MED ORDER — BENZONATATE 100 MG PO CAPS: 100.0000 mg | ORAL_CAPSULE | Freq: Three times a day (TID) | ORAL | 0 refills | Status: DC | PRN

## 2016-08-21 MED ORDER — TRAMADOL HCL 50 MG PO TABS: 50.0000 mg | ORAL_TABLET | Freq: Four times a day (QID) | ORAL | Status: DC | PRN

## 2016-08-21 MED ORDER — ATORVASTATIN CALCIUM 40 MG PO TABS: 40.0000 mg | ORAL_TABLET | Freq: Every day | ORAL | 1 refills | Status: DC

## 2016-08-21 MED ORDER — PREDNISONE 10 MG PO TABS: 30.0000 mg | ORAL_TABLET | Freq: Every day | ORAL | Status: DC

## 2016-08-21 MED ORDER — SPIRONOLACTONE 50 MG PO TABS: 50.0000 mg | ORAL_TABLET | Freq: Every day | ORAL | Status: DC

## 2016-08-21 MED ORDER — FUROSEMIDE 40 MG PO TABS: 40.0000 mg | ORAL_TABLET | Freq: Every day | ORAL | 1 refills | Status: DC

## 2016-08-21 MED ORDER — TERAZOSIN HCL 10 MG PO CAPS: 10.0000 mg | ORAL_CAPSULE | Freq: Every day | ORAL | Status: DC

## 2016-08-21 MED ORDER — HYDRALAZINE HCL 100 MG PO TABS: 100.0000 mg | ORAL_TABLET | Freq: Three times a day (TID) | ORAL | 0 refills | Status: DC

## 2016-08-21 MED ORDER — APIXABAN 5 MG PO TABS: 5.0000 mg | ORAL_TABLET | Freq: Two times a day (BID) | ORAL | 1 refills | Status: DC

## 2016-08-21 MED ORDER — PAROXETINE HCL 20 MG PO TABS: 20.0000 mg | ORAL_TABLET | Freq: Every day | ORAL | 1 refills | Status: AC

## 2016-08-21 MED ORDER — FEBUXOSTAT 40 MG PO TABS: 40.0000 mg | ORAL_TABLET | Freq: Every day | ORAL | 1 refills | Status: DC

## 2016-08-21 MED ORDER — LORAZEPAM 0.5 MG PO TABS: 0.5000 mg | ORAL_TABLET | ORAL | 0 refills | Status: AC | PRN

## 2016-08-21 MED ORDER — TRIAMCINOLONE ACETONIDE 0.1 % EX OINT: TOPICAL_OINTMENT | Freq: Two times a day (BID) | CUTANEOUS | 0 refills | Status: AC

## 2016-08-21 MED ORDER — COLCHICINE 0.6 MG PO TABS: 0.6000 mg | ORAL_TABLET | Freq: Every day | ORAL | 0 refills | Status: DC

## 2016-08-21 MED ORDER — GUAIFENESIN-DM 100-10 MG/5ML PO SYRP
5.0000 mL | ORAL_SOLUTION | ORAL | 0 refills | Status: DC | PRN
Start: 2016-08-21 — End: 2016-12-10

## 2016-08-21 MED ORDER — CARVEDILOL 6.25 MG PO TABS: 6.2500 mg | ORAL_TABLET | Freq: Two times a day (BID) | ORAL | Status: DC

## 2016-08-21 NOTE — Progress Notes (Signed)
MD confirmed DC plan with patient and nurse.  Pt understands and agrees.  He understands all DC instructions and the need to contact the Home health company if he does not hear from them within the next 24 HRS.  No other questions or concerns.  Will continue to monitor for any changes.

## 2016-08-21 NOTE — Progress Notes (Addendum)
Transitions of Care Pharmacy Note  Plan:  -Educated on discontinuing methyldopa, HCTZ, and atenolol, and starting hydralazine, furosemide, carvedilol, and spironolactone. -Educated on switch from pravastatin to atorvastatin. -Followed-up that patient had no questions after initial apixaban counseling with pharmacist, and counseled to stop taking aspirin. -Educated patient on prednisone taper and potential AE of steroids. --------------------------------------------- Shane Mathews is an 75 y.o. male who presents with a chief complaint of shortness of breath. In anticipation of discharge, pharmacy has reviewed this patient's prior to admission medication history, as well as [facility]  Current medication indications, dosing, frequency, and notable side effects reviewed with patient. patient verbalized understanding of current inpatient medication regimen and is aware that the After Visit Summary when presented, will represent the most accurate medication list at discharge.    Assessment: Understanding of regimen: fair Understanding of indications: fair Potential of compliance: good Barriers to Obtaining Medications: No  Patient instructed to contact inpatient pharmacy team with further questions or concerns if needed.    Time spent preparing for discharge counseling: 25 mins Time spent counseling patient: [phone]   Thank you for allowing pharmacy to be a part of this patient's care.  Fredonia HighlandMichael Bitonti, PharmD PGY-1 Pharmacy Resident Pager: [phone] 08/21/2016

## 2016-08-21 NOTE — Progress Notes (Signed)
    Subjective:  Feels good. No CP or dyspnea. No palpitations.   Objective:  Vital Signs in the last 24 hours: Temp:  [97.7 F (36.5 C)-98.3 F (36.8 C)] 97.8 F (36.6 C) (12/14 0900) Pulse Rate:  [66-72] 72 (12/14 0900) Resp:  [19-20] 20 (12/14 0433) BP: (150-169)/(39-54) 157/48 (12/14 1400) SpO2:  [96 %] 96 % (12/14 0900) Weight:  [275 lb (124.7 kg)] 275 lb (124.7 kg) (12/14 0433)  Intake/Output from previous day: 12/13 0701 - 12/14 0700 In: 1165 [P.O.:1165] Out: 1401 [Urine:1400; Stool:1]  Physical Exam: Pt is alert and oriented, pleasant obese male in NAD HEENT: normal Neck: JVP - normal Lungs: CTA bilaterally CV: irregular without murmur or gallop Abd: soft, NT, Positive BS, no hepatomegaly Ext: trace bilateral edema, distal pulses intact and equal Skin: warm/dry no rash   Lab Results:  Recent Labs  08/21/16 0456  WBC 8.3  HGB 11.0*  PLT 265    Recent Labs  08/20/16 0404 08/21/16 0456  NA 137 140  K 3.7 3.7  CL 97* 98*  CO2 31 33*  GLUCOSE 140* 118*  BUN 30* 30*  CREATININE 1.13 1.12   No results for input(s): TROPONINI in the last 72 hours.  Invalid input(s): CK, MB  Cardiac Studies: 2D Echo: Study Conclusions  - Left ventricle: The cavity size was normal. Wall thickness was   increased in a pattern of moderate LVH. Systolic function was   normal. The estimated ejection fraction was in the range of 60%   to 65%. Wall motion was normal; there were no regional wall   motion abnormalities. - Mitral valve: There was mild regurgitation. - Left atrium: The atrium was severely dilated.  Tele: Atrial fibrillation, HR 60's  Assessment/Plan:  1. Acute on chronic diastolic heart failure 2. Newly diagnosed atrial fibrillation, chads basked score of 5 3. Hypertensive heart disease  The patient is appropriately anticoagulated with apixaban. He is now on a combination of furosemide and Aldactone for diuretics. He is on an ACE inhibitor, beta  blocker, Ca++ blocker, alpha blocker, and hydralazine for blood pressure. Clinically he is much better and appears stable for hospital discharge. Will arrange close cardiology follow-up. I'm not sure that cardioversion without an antiarrhythmic drug will be successful in the long-term in this patient with obesity, poorly controlled hypertension, obstructive sleep apnea, and a severely dilated left atrium. Will arrange hospital follow-up to determine whether to pursue a rate or rhythm control strategy. At present he seems to be doing reasonably well with rate controlled atrial fibrillation now that he has been diuresed.  Tonny Bollmanooper, Montzerrat Brunell, M.D. 08/21/2016, 3:11 PM

## 2016-08-21 NOTE — Progress Notes (Signed)
Pt resting intermittently during the night, no c/o pain or distress, will continue to monitor.

## 2016-08-21 NOTE — Progress Notes (Signed)
Physical Therapy Treatment Patient Details Name: Shane Mathews MRN: [id number] DOB: [birth year] Today's Date: 08/21/2016    History of Present Illness Pt is a 75 y/o male admitted with acute DOE/SOB/pleural effusion.  PMH:  AFib, HF, HTN, venous insufficiency    PT Comments    Pt presented supine in bed with HOB elevated, awake and willing to participate in therapy session. Pt continuing to make steady progress with mobility. No reports of knee pain this session. Pt would continue to benefit from skilled physical therapy services at this time while admitted and after d/c to address his limitations in order to improve his overall safety and independence with functional mobility.   Follow Up Recommendations  SNF;Supervision/Assistance - 24 hour     Equipment Recommendations  None recommended by PT    Recommendations for Other Services       Precautions / Restrictions Precautions Precautions: Fall Precaution Comments: c/o L knee pain Restrictions Weight Bearing Restrictions: No    Mobility  Bed Mobility Overal bed mobility: Needs Assistance Bed Mobility: Supine to Sit;Sit to Supine     Supine to sit: HOB elevated;Supervision Sit to supine: Supervision   General bed mobility comments: pt required increased time, supervision for safety  Transfers Overall transfer level: Needs assistance Equipment used: Rolling walker (2 wheeled) Transfers: Sit to/from Stand Sit to Stand: Min guard         General transfer comment: min guard for safety, pt demonstrated good technique  Ambulation/Gait Ambulation/Gait assistance: Min guard Ambulation Distance (Feet): 75 Feet Assistive device: Rolling walker (2 wheeled) Gait Pattern/deviations: Step-through pattern;Decreased step length - right;Decreased step length - left;Decreased stride length;Trunk flexed Gait velocity: decreased Gait velocity interpretation: Below normal speed for age/gender General Gait Details: pt with no  c/o of knee pain or SOB. pt with increased cadence this session   Stairs            Wheelchair Mobility    Modified Rankin (Stroke Patients Only)       Balance Overall balance assessment: Needs assistance Sitting-balance support: Feet supported;No upper extremity supported Sitting balance-Leahy Scale: Good     Standing balance support: During functional activity;No upper extremity supported Standing balance-Leahy Scale: Fair                      Cognition Arousal/Alertness: Awake/alert Behavior During Therapy: WFL for tasks assessed/performed Overall Cognitive Status: Within Functional Limits for tasks assessed                      Exercises      General Comments        Pertinent Vitals/Pain Pain Assessment: No/denies pain Pain Intervention(s): Monitored during session    Home Living                      Prior Function            PT Goals (current goals can now be found in the care plan section) Acute Rehab PT Goals Patient Stated Goal: return home PT Goal Formulation: With patient Time For Goal Achievement: 08/30/16 Potential to Achieve Goals: Fair Progress towards PT goals: Progressing toward goals    Frequency    Min 3X/week      PT Plan Current plan remains appropriate    Co-evaluation             End of Session Equipment Utilized During Treatment: Gait belt Activity Tolerance: Patient tolerated treatment well Patient  left: in bed;with call bell/phone within reach     Time: 1015-1031 PT Time Calculation (min) (ACUTE ONLY): 16 min  Charges:  $Gait Training: 8-22 mins                    G CodesAlessandra Bevels:      Binta Statzer M Reida Hem 08/21/2016, 11:03 AM Deborah ChalkJennifer Jesaiah Fabiano, PT, DPT [phone]

## 2016-08-21 NOTE — Clinical Social Work Note (Addendum)
Patient has accepted bed offer at [facility]. SNF aware. [facility]. Authorization will take about 4 hours to get. Patient cannot discharge to SNF without authorization or they will decline.  Charlynn CourtSarah Perle Gibbon, CSW [phone]  2:02 pm [facility], patient will require a peer-to-peer review after scoring high on his functional assessment. CSW paged MD. Awaiting call back.  Charlynn CourtSarah Michaelina Blandino, CSW [phone]  2:28 pm Dr. Loni MuseKate Timberlake will be doing the peer-to-peer review [facility]. CSW called and left voicemail for [employer] CorporationFelise, Sports coachcase manager at [facility], with Dr. Christena Flakeimberlake's phone number.  Charlynn CourtSarah Farris Geiman, CSW [phone]

## 2016-08-21 NOTE — Progress Notes (Signed)
Family Medicine Teaching Service Daily Progress Note Intern Pager: [phone]  [facility] number: [id number] Date of birth: December 23, 1940 Age: 75 y.o. Gender: male  Primary Care Provider: Sanjuana LettersHENSEL,WILLIAM ARTHUR, MD Consultants: None Code Status: Full  Pt Overview and Major Events to Date:  08/12/16 - Admit to FMTS  Assessment and Plan: Shane Mathews is a 75 y.o. male presenting with exertional SOB . PMH is significant for HTN, chronic venous insufficiency, Anxiety, GERD, Claudication, Hyperlipidemia  CHF exacerbation, improving:  Weight down 4 pounds from yesterday with a total -32 pounds since admit. Switched to PO lasix yesterday and had UOP 1.4L past 24 hrs. - Strict I's and O's - Daily weights - lasix 40mg  QD PO - Cardiology following - appreciate recs: oral lasix today  Intermittent bradycardia: Asymptomatic. Stable overnight.  - cont coreg to 6.25mg  BID - cont to monitor.   New onset afb:  Consider 3 weeks uninterrupted anticoagulation with outpatient DCCV in 3 weeks  - Cardiology consulted; appreciate recommendations - Apixaban 5mg  BID - Continue to monitor SOB - possible DCCV in 3 weeks outpatient.   Hypertension:  24hr urine metanepherines and cortisol pending. BP remains elevated up to 162/51  Given low diastolic pressure, decreased terazosin to 10mg  daily and spironolactone to 50mg  daily on 12/11.  - Continue norvasc 10mg  daily, enalapril 20mg  BID and hydralazine 100mg  TID, terazosin 10mg  daily, spironolactone to 50mg , coreg 6.25mg  BID - F/u 24hr cortisol and VMA - Consider additional renal imaging after discharge  Cough, stable:  - Continue to monitor - Tessalon 100mg  TID PRN - Consider albuterol PRN and PFTs  Anemia, stable: Hemoglobin of 11.2 noted on admission. MCV 73. Due for colonoscopy. FOBT negative in 08/2015.  - Daily CBC - Consider Iron studies if no improvement  History of gout:  Patient complaining of acute L knee  pain that feels similar to prior gout flares.  Much improved this morning. Will continue to cncourage PT/OT.  -cont 30mg  prednisone daily and for another 2 days after resolution of symptoms - started colchicine 0.6mg  daily on 12/11 - Cont febuxostat 40mg  qd  Hypokalemia, resolved. - F/u BMP - cont spironolactone to 50mg  daily  Panic Attacks, stable: Last documentation and office visit was in 2014. Currently only on Paxil 20 mg daily.   - Continue Paxil 20 mg qd - Ativan 0.5mg  BID q4hrs PRN  Hypertriglyceridemia: Lipid panel showed LDL 36, otherwise normal. - Continue lipitor 40mg  qd  GERD:  - Cont home Protonix 20mg  qd  Intermittent Claudication:  Reported claudication in bilateral lower extremities in the past. ABIs show moderate arterial disease (ABI: R: 0.71, L: 0.69). Patient denies current symptoms.  - Continue lipitor 40mg  daily - Consider TED hose - Consider vascular f/u outpatient  FEN/GI: Heart healthy diet/ protonix Prophylaxis: subQ heparin  Disposition: discharge to SNF when stable  Subjective:  Patient feels improved this morning.  Denies any SOB, CP, palpitations or NVD.    Objective: Temp:  [97.7 F (36.5 C)-98.3 F (36.8 C)] 97.7 F (36.5 C) (12/14 0433) Pulse Rate:  [61-72] 67 (12/14 0433) Resp:  [18-20] 20 (12/14 0433) BP: (154-169)/(39-54) 162/51 (12/14 0433) SpO2:  [96 %-99 %] 96 % (12/14 0433) Weight:  [275 lb (124.7 kg)] 275 lb (124.7 kg) (12/14 0433) Physical Exam: General: very pleasant 75 year old male sitting up in bed in NAD ENTM: MMM Cardiovascular: RRR, no murmurs appreciated Respiratory: NWOB on RA, CTAB, crackles at bases Gastrointestinal: soft, NTND, +BS MSK: Moves all extremities  spontaneously, improving pain in L knee and L ankle Extremities: 1+ pitting edema in lower extremities up to shins bilaterally.  Derm: Chronic venous stasis changes seen on bilateral lower extremities with flaking skin bilaterally Neuro: A&Ox3 Psych:  Appropriate mood and affect  Laboratory:  Recent Labs Lab 08/17/16 0857 08/18/16 0523 08/21/16 0456  WBC 9.6 9.5 8.3  HGB 10.9* 11.0* 11.0*  HCT 34.1* 34.2* 35.7*  PLT 219 212 265    Recent Labs Lab 08/19/16 0407 08/20/16 0404 08/21/16 0456  NA 132* 137 140  K 3.9 3.7 3.7  CL 93* 97* 98*  CO2 29 31 33*  BUN 22* 30* 30*  CREATININE 1.13 1.13 1.12  CALCIUM 8.6* 8.8* 8.7*  GLUCOSE 169* 140* 118*    Imaging/Diagnostic Tests: No results found.  Renne Muscaaniel L Wandell Scullion, MD 08/21/2016, 8:18 AM PGY-1, [facility] FPTS Intern pager: [phone], text pages welcome

## 2016-08-21 NOTE — Care Management Note (Signed)
Case Management Note  Patient Details  Name: Shane Fearingnthony H Wayne MRN: [id number] Date of Birth: 07/04/1941  Subjective/Objective:                 75 year old male with past medical history of hypertension refractory to 6 medications, venous insufficiency, intermittent claudication who presented to clinic with acute shortness of breath concerning for CHF exacerbation  Anticipated discharge was SNF but received a denial from insurance so [facility]     Action/Plan: CM spoke with patient's wife Rodman PickleHeather Chubbuck in patient's room by phone, to discuss [facility].  Patient and family agreeable, offered [facility]  [phone], fax confirmation received.  Patient's wife reports have DME rolling walker and bedside commode at home. No further questions or concerns verbalized. No additional  CM needs verbalized   Expected Discharge Date:       08/21/16           Expected Discharge Plan:  Home w Home Health Services  In-House Referral:     Discharge planning Services  CM Consult  Post Acute Care Choice:    Choice offered to:     DME Arranged:    DME Agency:     HH Arranged:    HH Agency:     Status of Service:   completed signed off  If discussed at [employer] Meetings, dates discussed:    Additional CommentsMichel Bickers:  Ingra Rother, RN 08/21/2016, 7:25 PM

## 2016-08-21 NOTE — Progress Notes (Signed)
Cleared up DC discrepancies with Attending MD and on call Cm.  Patient's Home health was verified by CM and DC plans were reaffirmed by Md.  Will continue to DC patient as ordered.  Report given to pm shift. They will continue finishing up Dc.  No other problems or concerns.

## 2016-08-22 NOTE — Clinical Social Work Note (Signed)
[facility] stating that patient's authorization was denied because patient is too high functioning. CSW notified [employer] that patient discharged home last night.  Shane CourtSarah Latika Kronick, CSW [phone]

## 2016-08-24 LAB — CORTISOL, URINE, FREE
CORTISOL (UR), FREE: 1 ug/(24.h) (ref 0–50)
CORTISOL,F,UG/L,U: 33 ug/L
TOTAL VOLUME: 24
VOLUME, URINE-CORTUR: 1300 mL

## 2016-08-25 ENCOUNTER — Telehealth: Payer: Self-pay | Admitting: *Deleted

## 2016-08-25 NOTE — Telephone Encounter (Signed)
Transitional Care Clinic Post-discharge Follow-Up Phone Call:  Date of Discharge: 08/21/2016 Principal Discharge Diagnosis(es): New onset A-Fib, new onset CHF Post-discharge Communication: Attempt #1 to reach patient and complete post-discharge follow-up phone call. Call placed to #[phone]; unable to reach patient. HIPPA compliant voicemail left requesting return call. Call Completed: No     L. Leward Quanucatte, RN, BSN

## 2016-08-25 NOTE — Telephone Encounter (Signed)
Transitional Care Clinic Post-discharge Follow-Up Phone Call:  Date of Discharge: 08/21/2016 Principal Discharge Diagnosis(es): New onset A-Fib, new onset CHF Post-discharge Communication: Attempt #2 to reach patient and complete post-discharge follow-up phone call. Call placed to #[phone]; unable to reach patient. HIPPA compliant voicemail left requesting return call. Call Completed: No                     Kinnie FeilL. Tiegan Terpstra, RN, BSN

## 2016-08-26 NOTE — Telephone Encounter (Signed)
Transitional Care Clinic Post-discharge Follow-Up Phone Call:  Date of Discharge:08/21/2016 Principal Discharge Diagnosis(es):New onset A-Fib, new onset CHF Post-discharge Communication: Attempt #3 to reach patient and complete post-discharge follow-up phone call. Call placed to #[phone]; unable to reach patient. HIPPA compliant voicemail left requesting return call. Call Completed: No L. Leward Quanucatte, RN, BSN

## 2016-08-26 NOTE — Telephone Encounter (Signed)
Transitional Care Post-Discharge Follow-Up Phone Call:   Admit date: 08/12/2016 Discharge date: 08/21/2016  Discharge Disposition: Home  Best patient contact number: [phone] Emergency contact(s): Wife PCP: [facility]  Principal Discharge Diagnosis: New onset A-Fib, new onset CHF  Reason for Chronic Case Management: Co-morbidities as follows:  HTN, Chronic venous insufficiency, claudication, morbid obesity, anxiety  Post-discharge Communication: (Clearly document all attempts clearly and date contact made)   Call Completed: Yes, with patient   Interpreter Needed: No   Please check all that apply:  X Patient is caring for self at home. Wife helps ? Patient has caregiver. If so, name and best contact number:  X Patient is knowledgeable of his/her condition(s) and/or treatment.  ? Family and/or caregiver is knowledgeable of patient's condition(s) and/or treatment.   Medication Reconciliation:  ? Medication list reviewed with patient.  X Patient has all discharge medications States he has all discharge meds, "took 3 days to get everything straight." States he went through discharge paperwork and pulled out all meds he was supposed to stop. ? Patient has O2/CPAP ordered? If so, name of company that supplies:  Activities of Daily Living:  X Independent  ? Needs assist (describe)  ? Total Care (describe)   Community resources in place for patient:  X None  ? Home Health If so, name of agency: ? [facility] If so, name of Care Manager and contact number:   ? Assisted Living  ? Hospice  ? Support Group    Topics discussed:   Transportation: Barriers? Denies  Food/Nutrition: (ability to afford, access, use of any community resources) Denies food insecurity  Would patient benefit from consult with Social Work? Behavioral Health? Pharm D? Not at this time  [facility] scheduled for: 08/28/2016 @ 0830 with Dr. Jonathon JordanGambino as PCP is unavailable  Identified Barriers: Home BP monitor  broken. Patient states he will purchase new one as well as scale. Patient instructed on most accurate way to take BP, to keep BP log with date and time and how he is feeling and bring log to all future visits. Patient instructed to weigh daily without clothes upon waking and after voiding Patient instructed to record daily weights and bring log to all future appts and call office if 4 lbs or more gained within 2 day period. Patient states understanding.  Patient Education: BP and weight as above. Also discussed need for low sodium diet and using Mrs. Dash instead of salt. States he used Mrs. Dash in hospital and liked it. Discussed choosing foods with 5% or less of DV for sodium.  Questions/concerns: Patient states he is feeling, "much, much better." Denies LE edema, SHOB. States he "didn't know his legs could be so skinny."             L. Tonnya Garbett, RN, BSN

## 2016-08-27 NOTE — Telephone Encounter (Signed)
Noted and agree. 

## 2016-08-28 ENCOUNTER — Telehealth: Payer: Self-pay | Admitting: Family Medicine

## 2016-08-28 ENCOUNTER — Encounter: Payer: Self-pay | Admitting: Family Medicine

## 2016-08-28 ENCOUNTER — Ambulatory Visit (INDEPENDENT_AMBULATORY_CARE_PROVIDER_SITE_OTHER): Payer: Medicare Other | Admitting: Family Medicine

## 2016-08-28 VITALS — BP 118/46 | HR 86 | Temp 97.5°F | Ht 72.0 in | Wt 267.6 lb

## 2016-08-28 DIAGNOSIS — M109 Gout, unspecified: Secondary | ICD-10-CM | POA: Diagnosis not present

## 2016-08-28 DIAGNOSIS — I1A0 Resistant hypertension: Secondary | ICD-10-CM

## 2016-08-28 DIAGNOSIS — I10 Essential (primary) hypertension: Secondary | ICD-10-CM | POA: Diagnosis not present

## 2016-08-28 DIAGNOSIS — I481 Persistent atrial fibrillation: Secondary | ICD-10-CM

## 2016-08-28 DIAGNOSIS — Z9189 Other specified personal risk factors, not elsewhere classified: Secondary | ICD-10-CM

## 2016-08-28 DIAGNOSIS — R7303 Prediabetes: Secondary | ICD-10-CM

## 2016-08-28 DIAGNOSIS — I4819 Other persistent atrial fibrillation: Secondary | ICD-10-CM

## 2016-08-28 NOTE — Progress Notes (Signed)
Subjective:    Patient ID: Shane Mathews , male   DOB: 06-07-1941 , 75 y.o..   MRN: [id number]  HPI  Shane Mathews is 75 yo male with PMH of new A-fib, , CHF, refractory hypertension, PAD, intermittent claudication, and gout who presents for hospital follow up.                                                      TRANSITION OF CARE VISIT   Primary Care Physician (PCP): Dr. Marcelo BaldyHensel                                                    [facility]                                                   [address]                                                   [place], [place] [id number]                                                   [place]  Date of Admission: 08/12/16  Date of Discharge: 08/21/16  [facility]  Discharge Diagnosis:  Patient Active Problem List   Diagnosis Date Noted  . Prediabetes 08/28/2016  . 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 Acute gouty arthritis 08/21/2016  . Aspiration pneumonia (HCC)   . Adrenal insufficiency (HCC)   . PAD (peripheral artery disease) (HCC) 08/18/2016  . Abnormal thyroid function test 08/18/2016  . Resistant hypertension   . Hypertensive heart disease 08/14/2016  . Acute on chronic diastolic heart failure (HCC) 08/14/2016  . Hyperglycemia 08/13/2016  . Hypokalemia 08/13/2016  . Pleural effusion 08/13/2016  . Absent pulse in lower extremity 08/13/2016  . Bradycardia   . Dyspnea 08/12/2016  . Persistent atrial fibrillation (HCC)   . Edema   . Atherosclerosis of native arteries of extremity with intermittent claudication (HCC) 08/09/2015  . Anemia, iron deficiency 03/17/2014  . Chronic venous insufficiency 03/15/2014  . Pure hypercholesterolemia 12/01/2012  . HIP PAIN 02/27/2010  . Gout 04/20/2009  . PANIC ATTACK 01/08/2007  . HYPERTRIGLYCERIDEMIA 11/05/2006  . Morbid obesity (HCC) 11/05/2006  . CATARACT 11/05/2006  . REFLUX ESOPHAGITIS 11/05/2006    Summary of Admission: Patient was admitted to  the hospital for shortness of breath  TODAY's VISIT  Patient/Caregiver self-reported problems/concerns:   1. Atrial fibrillation: Patient denies any chest pain, palpitations, shortness of breath, dizziness. He states that he feels great.  He notes that he is going to follow-up with his cardiologist very soon. He continues to take his medications as prescribed but he does not know the name of  these medications. He is curious how he will know if he is in A. fib. 2. Gout: Patient denies any joint pain at this time. He notes that all of his gout symptoms have resolved. Denies any fever. 3. Hypertension: Patient states that he has not checked his blood pressure. He admits taking all of his antihypertensive medications. He notes that sometimes when he stands up she'll feel little lightheaded. 4. Prediabetes: Patient states that he has been eliminating a lot of sugar from his diet and going to more sugar-free foods. He feels like now that he is out of the hospital he can be more active.  MEDICATIONS  Medication Reconciliation conducted with patient/caregiver? (Yes/ No): No. Patient did not bring a list of his medications today and does not know which medications he is actually taking.   New medications prescribed/discontinued upon discharge? (Yes/No): Yes  Barriers identified related to medications: None  LABS  Lab Reviewed (Yes/No/NA): Yes  PATIENT EDUCATION PROVIDED: See AVS   Review of Systems: Per HPI.  Social Hx:  reports that he quit smoking about 25 years ago. He has never used smokeless tobacco.   Objective:   BP (!) 118/46 (BP Location: Right Arm, Patient Position: Sitting, Cuff Size: Large)   Pulse 86   Temp 97.5 F (36.4 C) (Oral)   Ht 6' (1.829 m)   Wt 267 lb 9.6 oz (121.4 kg)   SpO2 97%   BMI 36.29 kg/m  Physical Exam  Gen: NAD, alert, cooperative with exam, well-appearing HEENT: NCAT, PERRL, clear conjunctiva, oropharynx clear, supple neck. White lesion noted on the  tip of his tongue  Cardiac: Irregular rhythm, normal S1/S2, no murmur, no edema, capillary refill brisk  Respiratory: Clear to auscultation bilaterally, no wheezes, non-labored breathing Gastrointestinal: soft, non tender, non distended, bowel sounds present Skin: no rashes, normal turgor, hyperpigmentation seen in lower extremities  Neurological: no gross deficits.  Psych: good insight, normal mood and affect  Assessment & Plan:  Resistant hypertension Much improved from hospitalization, blood pressure is normal today and even diastolic is slightly low. Blood pressure 118/46 and was 108/42 when rechecked later in the visit. Negative orthostatic vitals. Initially concerned that patient's blood pressure was too low and he stated that he had some lightheadedness when he stands up too quickly.  -Told patient to stop hydralazine and Terazosin in the office. Per Dr. Cyndia SkeetersHensel's telephone note from today after office visit, patient was instructed to just stop Terazosin and continue hydralazine. Will follow-up with PCP and cardiology within the next 2 weeks. -Continue norvasc 10mg  daily, enalapril 20mg  BID, hydralazine 100mg  TID, spironolactone to 50mg , coreg 6.25mg  BID -BMP today to monitor creatinine function and electrolytes since patient is on ACE inhibitor and aldosterone antagonist  Persistent atrial fibrillation (HCC) Irregular rhythm today but is rate controlled. Patient is asymptomatic and feeling much better than when he was hospitalized. Continue Eliquis and carvedilol. Follow-up with cardiology next week.  Gout Resolved. -Will recheck uric acid today. -Discontinue colchicine if levels are WNL and continue febuxostat.   Prediabetes Hemoglobin A1c 6.4. Patient not having any hyper or hypoglycemic symptoms. -Encouraged patient to continue lowering sugar and carbohydrates from his diet -Encouraged patient to continue with light exercise -Consider checking A1c in 3 months   Anders Simmondshristina  Halynn Reitano, MD [facility], PGY-2

## 2016-08-28 NOTE — Assessment & Plan Note (Addendum)
Much improved from hospitalization, blood pressure is normal today and even diastolic is slightly low. Blood pressure 118/46 and was 108/42 when rechecked later in the visit. Negative orthostatic vitals. Initially concerned that patient's blood pressure was too low and he stated that he had some lightheadedness when he stands up too quickly.  -Told patient to stop hydralazine and Terazosin in the office. Per Dr. Cyndia SkeetersHensel's telephone note from today after office visit, patient was instructed to just stop Terazosin and continue hydralazine. Will follow-up with PCP and cardiology within the next 2 weeks. -Continue norvasc 10mg  daily, enalapril 20mg  BID, hydralazine 100mg  TID, spironolactone to 50mg , coreg 6.25mg  BID -BMP today to monitor creatinine function and electrolytes since patient is on ACE inhibitor and aldosterone antagonist

## 2016-08-28 NOTE — Assessment & Plan Note (Signed)
Hemoglobin A1c 6.4. Patient not having any hyper or hypoglycemic symptoms. -Encouraged patient to continue lowering sugar and carbohydrates from his diet -Encouraged patient to continue with light exercise -Consider checking A1c in 3 months

## 2016-08-28 NOTE — Assessment & Plan Note (Addendum)
Resolved. -Will recheck uric acid today. -Discontinue colchicine if levels are WNL and continue febuxostat.

## 2016-08-28 NOTE — Telephone Encounter (Signed)
Wife is calling back with some information about her husbands medication. She said that the list that was discuss is accurate. The only 3 that were different. Colchicine 1 tablet a day, Paroxetine 1 tablet a day, Terazosin 1 tablet a day. The only issue is that the hospital told them to following the new directions for all 3 of these were changed and to following new instructions. She is not sure what new instructions they are talking about. jw

## 2016-08-28 NOTE — Telephone Encounter (Signed)
Seen by Dr. Jonathon JordanGambino.  Per both patient and her note, told to stop both terazocin and hydralazine.  He denied orthostatic symptoms.  He states that he feels great - night and day better than when admitted to the hospital.  Given lack of symptoms, advised to only stop the terazocin and not the hydralazine.  He has an appointment to see me soon.  Knows to call back if lightheaded symptoms.

## 2016-08-28 NOTE — Assessment & Plan Note (Addendum)
Irregular rhythm today but is rate controlled. Patient is asymptomatic and feeling much better than when he was hospitalized. Continue Eliquis and carvedilol. Follow-up with cardiology next week.

## 2016-08-28 NOTE — Patient Instructions (Addendum)
Thank you for coming in today, it was so nice to see you! Today we talked about:    Blood pressure: STOP Hydralazine and Terazosin. Continue taking Norvasc, enalapril, spironolactone, coreg  Medications: Please call the clinic and tell them that Dr. Jonathon JordanGambino wanted to know which medicines are taking, please tell them which medicines you are taking and they will send a message to me.  Gout: We will check a uric acid level in your blood to see if you're still having a gout flare. I will follow up with you on this result  Prediabetes: You're doing a great job at keeping to a low sugar diet, keep up the good work and we will recheck your sugar levels in the future  Please follow up in 2 weeks with Dr. Leveda AnnaHensel. You can schedule this appointment at the front desk before you leave or call the clinic.  Bring in all your medications or supplements to each appointment for review.   If we ordered any tests today, you will be notified via telephone of any abnormalities. If everything is normal you will get a letter in the mail.   If you have any questions or concerns, please do not hesitate to call the office at [phone]. You can also message me directly via MyChart.   Sincerely,  Anders Simmondshristina Miguel Medal, MD

## 2016-08-29 LAB — BASIC METABOLIC PANEL
BUN: 34 mg/dL — AB (ref 7–25)
CHLORIDE: 100 mmol/L (ref 98–110)
CO2: 24 mmol/L (ref 20–31)
Calcium: 9.1 mg/dL (ref 8.6–10.3)
Creat: 1.35 mg/dL — ABNORMAL HIGH (ref 0.70–1.18)
Glucose, Bld: 128 mg/dL — ABNORMAL HIGH (ref 65–99)
POTASSIUM: 4.6 mmol/L (ref 3.5–5.3)
SODIUM: 142 mmol/L (ref 135–146)

## 2016-08-29 LAB — URIC ACID: Uric Acid, Serum: 4.8 mg/dL (ref 4.0–8.0)

## 2016-09-03 ENCOUNTER — Ambulatory Visit (INDEPENDENT_AMBULATORY_CARE_PROVIDER_SITE_OTHER): Payer: Medicare Other | Admitting: Internal Medicine

## 2016-09-03 ENCOUNTER — Telehealth: Payer: Self-pay | Admitting: Family Medicine

## 2016-09-03 ENCOUNTER — Encounter: Payer: Self-pay | Admitting: Internal Medicine

## 2016-09-03 DIAGNOSIS — R1084 Generalized abdominal pain: Secondary | ICD-10-CM | POA: Diagnosis not present

## 2016-09-03 DIAGNOSIS — R109 Unspecified abdominal pain: Secondary | ICD-10-CM | POA: Insufficient documentation

## 2016-09-03 MED ORDER — DOCUSATE SODIUM 100 MG PO CAPS: 100.0000 mg | ORAL_CAPSULE | Freq: Two times a day (BID) | ORAL | 0 refills | Status: DC | PRN

## 2016-09-03 MED ORDER — SENNA 8.6 MG PO TABS: 2.0000 | ORAL_TABLET | Freq: Every evening | ORAL | 0 refills | Status: DC | PRN

## 2016-09-03 NOTE — Patient Instructions (Addendum)
It was nice meeting you today Shane Mathews!  Your abdominal pain seems most likely due to constipation. To help with your constipation, I have prescribed two medications.  Please begin taking Colace (a stool softener) twice a day until your constipation has resolved. Please also begin taking Senna (a laxative) at night until your constipation has resolved. It is also important to continue drinking plenty of water.   If your constipation does not improve in a few days, or if your abdominal pain worsens, please call the office to let us know.   If you have any questions or concerns, please feel free to call the clinic.   Be well,  Dr. Natale MilchLancaster

## 2016-09-03 NOTE — Telephone Encounter (Signed)
Called patient to discuss normal uric acid level. Advised that he stop colchicine. Will take off medication list. He was appreciative of the call.

## 2016-09-03 NOTE — Assessment & Plan Note (Signed)
Seems most consistent with pain 2/2 constipation. Given dull, mild nature of pain, gallstones less likely. Pain in RUQ and unremarkable abdominal exam also make appendicitis less likely. No fevers, vomiting, or diarrhea suggestive of infectious etiology. Will try course of stool softener and laxative.  - Colace BID PRN - Senna QHS PRN - Encouraged to continue to remain hydrated - Return if symptoms do not improve or if pain worsens

## 2016-09-03 NOTE — Progress Notes (Signed)
   Subjective:   Patient: Shane Mathews       Birthdate: 10/27/1940       MRN: [id number]      HPI  Shane Mathews is a 75 y.o. male presenting for same day visit for abdominal pain.   Patient reports intermittent dull abdominal pain for the past two days. Says that pain is not very bad at all, and is about a 4/10. He thinks his pain is due to constipation, as he has not had a normal bowel movement in about 4 days. He did have a small bowel movement this morning. He does not take any stool softeners or laxatives. He has not taken anything to help with the pain because he says it is not severe enough. He also reports some nausea, but no vomiting. He cannot say if the pain and nausea is associated with eating or not. He is drinking normally, but says he has been eating slightly less in the past couple days, more due to nausea than pain. Denies fevers. He has not had this type of pain before. He thinks he had gall stones a few years ago, and says he was given a medication to "clean him out," which resolved the pain. He has not had any difficulty sleeping due to pain or nausea.   Smoking status reviewed. Patient is former smoker (quit 25 years ago).   Review of Systems See HPI.     Objective:  Physical Exam  Constitutional: He is oriented to person, place, and time and well-developed, well-nourished, and in no distress.  HENT:  Head: Normocephalic and atraumatic.  Eyes: Conjunctivae and EOM are normal. Right eye exhibits no discharge. Left eye exhibits no discharge.  Pulmonary/Chest: Effort normal. No respiratory distress.  Abdominal: Soft. Bowel sounds are normal. He exhibits no distension. There is no tenderness. There is no rebound.  Neurological: He is alert and oriented to person, place, and time.  Skin: Skin is warm and dry.  Psychiatric: Affect and judgment normal.   Assessment & Plan:  Abdominal pain Seems most consistent with pain 2/2 constipation. Given dull, mild nature of  pain, gallstones less likely. Pain in RUQ and unremarkable abdominal exam also make appendicitis less likely. No fevers, vomiting, or diarrhea suggestive of infectious etiology. Will try course of stool softener and laxative.  - Colace BID PRN - Senna QHS PRN - Encouraged to continue to remain hydrated - Return if symptoms do not improve or if pain worsens   Tarri AbernethyAbigail J Lancaster, MD, MPH PGY-2 [facility] Pager [phone]

## 2016-09-04 ENCOUNTER — Encounter: Payer: Self-pay | Admitting: Physician Assistant

## 2016-09-04 ENCOUNTER — Ambulatory Visit (INDEPENDENT_AMBULATORY_CARE_PROVIDER_SITE_OTHER): Payer: Medicare Other | Admitting: Physician Assistant

## 2016-09-04 VITALS — BP 130/64 | HR 86 | Ht 72.0 in | Wt 261.8 lb

## 2016-09-04 DIAGNOSIS — Z7901 Long term (current) use of anticoagulants: Secondary | ICD-10-CM

## 2016-09-04 DIAGNOSIS — I481 Persistent atrial fibrillation: Secondary | ICD-10-CM

## 2016-09-04 DIAGNOSIS — I5032 Chronic diastolic (congestive) heart failure: Secondary | ICD-10-CM

## 2016-09-04 DIAGNOSIS — Z79899 Other long term (current) drug therapy: Secondary | ICD-10-CM

## 2016-09-04 DIAGNOSIS — I110 Hypertensive heart disease with heart failure: Secondary | ICD-10-CM | POA: Diagnosis not present

## 2016-09-04 DIAGNOSIS — I4819 Other persistent atrial fibrillation: Secondary | ICD-10-CM

## 2016-09-04 LAB — BASIC METABOLIC PANEL
BUN: 31 mg/dL — ABNORMAL HIGH (ref 7–25)
CALCIUM: 8.6 mg/dL (ref 8.6–10.3)
CO2: 26 mmol/L (ref 20–31)
CREATININE: 1.46 mg/dL — AB (ref 0.70–1.18)
Chloride: 97 mmol/L — ABNORMAL LOW (ref 98–110)
Glucose, Bld: 143 mg/dL — ABNORMAL HIGH (ref 65–99)
Potassium: 4 mmol/L (ref 3.5–5.3)
SODIUM: 135 mmol/L (ref 135–146)

## 2016-09-04 NOTE — Progress Notes (Signed)
Cardiology Office Note   Date:  09/04/2016   ID:  Shane Mathews, DOB November 28, 1940, MRN [id number]  PCP:  Sanjuana Letters, MD  Cardiologist:  Dr Verne Carrow, PA-C   No chief complaint on file.   History of Present Illness: Shane Mathews is a 75 y.o. male with a history of PAD, HTN, HLD, OSA, gout, venous insufficiency, anxiety, RBBB.  Admit 12/04-12/14 with D-CHF, pleural effusion, and persistent afib. Started on Eliquis and carvedilol. Metoprolol was pta med, d/c'd due to bradycardia.   Shane Mathews presents for follow up. He is here today with his wife.  He has done well since d/c. Struggles a bit with low Na+, but is reading labels. He is eating less. He is weighing daily and his weight is 250 lbs on home scales first am.   No orthopnea, PND. Still w/ DOE but feels "150%" better than when admitted. Working on Systems analyst.   No chest pain. No palpitations. No awareness of irregular HR. No problems with anticoagulation. +constipation, OTC meds helping. No other ongoing issues.    Past Medical History:  Diagnosis Date  . Acute on chronic diastolic heart failure (HCC) 08/14/2016  . Cataract   . Gout   . Hypertension   . Hypertensive heart disease 08/14/2016    Past Surgical History:  Procedure Laterality Date  . KNEE CARTILAGE SURGERY     left    Current Outpatient Prescriptions  Medication Sig Dispense Refill  . amLODipine (NORVASC) 10 MG tablet Take 10 mg by mouth daily.    Marland Kitchen apixaban (ELIQUIS) 5 MG TABS tablet Take 1 tablet (5 mg total) by mouth 2 (two) times daily. 60 tablet 1  . atorvastatin (LIPITOR) 40 MG tablet Take 1 tablet (40 mg total) by mouth daily at 6 PM. 30 tablet 1  . benzonatate (TESSALON) 100 MG capsule Take 1 capsule (100 mg total) by mouth 3 (three) times daily as needed for cough. 20 capsule 0  . carvedilol (COREG) 6.25 MG tablet Take 1 tablet (6.25 mg total) by mouth 2 (two) times daily with a meal.    .  enalapril (VASOTEC) 20 MG tablet TAKE ONE TABLET TWICE DAILY (Patient taking differently: Take 20 mg by mouth two times a day) 180 tablet 3  . febuxostat (ULORIC) 40 MG tablet Take 1 tablet (40 mg total) by mouth daily. 30 tablet 1  . furosemide (LASIX) 40 MG tablet Take 1 tablet (40 mg total) by mouth daily. 30 tablet 1  . guaiFENesin-dextromethorphan (ROBITUSSIN DM) 100-10 MG/5ML syrup Take 5 mLs by mouth every 4 (four) hours as needed for cough. 118 mL 0  . hydrALAZINE (APRESOLINE) 100 MG tablet Take 1 tablet (100 mg total) by mouth every 8 (eight) hours. 90 tablet 0  . LORazepam (ATIVAN) 0.5 MG tablet Take 1 tablet (0.5 mg total) by mouth every 4 (four) hours as needed for anxiety (panic attack). 30 tablet 0  . PARoxetine (PAXIL) 20 MG tablet Take 1 tablet (20 mg total) by mouth daily. 30 tablet 1  . senna (SENOKOT) 8.6 MG TABS tablet Take 2 tablets (17.2 mg total) by mouth at bedtime as needed for mild constipation. 20 each 0  . spironolactone (ALDACTONE) 50 MG tablet Take 1 tablet (50 mg total) by mouth daily.    . traMADol (ULTRAM) 50 MG tablet Take 1 tablet (50 mg total) by mouth every 6 (six) hours as needed for moderate pain. 30 tablet   . triamcinolone ointment (  KENALOG) 0.1 % Apply topically 2 (two) times daily. 30 g 0   No current facility-administered medications for this visit.     Allergies:   Asa [aspirin]; Allopurinol; and Other    Social History:  The patient  reports that he quit smoking about 25 years ago. He has never used smokeless tobacco. He reports that he does not drink alcohol or use drugs.   Family History:  The patient's family history includes Cancer in his mother; Osteoporosis in his mother.    ROS:  Please see the history of present illness. All other systems are reviewed and negative.    PHYSICAL EXAM: VS:  BP 130/64   Pulse 86   Ht 6' (1.829 m)   Wt 261 lb 12.8 oz (118.8 kg)   BMI 35.51 kg/m  , BMI Body mass index is 35.51 kg/m. GEN: Well  nourished, well developed, male in no acute distress  HEENT: normal for age  Neck: minimal JVD, no carotid bruit, no masses Cardiac: Irreg R&R; no murmur, no rubs, or gallops Respiratory: decreased BS bases bilaterally, normal work of breathing GI: soft, nontender, nondistended, + BS MS: no deformity or atrophy; no edema; distal pulses are 2+ in all 4 extremities   Skin: warm and dry, no rash Neuro:  Strength and sensation are intact Psych: euthymic mood, full affect   EKG:  EKG is ordered today. The ekg ordered today demonstrates Atrial fib, controlled rate RBBB is old.  ECHO: 08/13/2016 - Left ventricle: The cavity size was normal. Wall thickness was   increased in a pattern of moderate LVH. Systolic function was   normal. The estimated ejection fraction was in the range of 60%   to 65%. Wall motion was normal; there were no regional wall   motion abnormalities. Unable to estimate diastolic dysfunction - Mitral valve: There was mild regurgitation. - Left atrium: The atrium was severely dilated.   Recent Labs: 08/12/2016: ALT 14; B Natriuretic Peptide 498.9 08/13/2016: Magnesium 2.1 08/14/2016: TSH 2.080 08/21/2016: Hemoglobin 11.0; Platelets 265 08/28/2016: BUN 34; Creat 1.35; Potassium 4.6; Sodium 142    Lipid Panel    Component Value Date/Time   CHOL 99 08/12/2016 1501   TRIG 137 08/12/2016 1501   HDL 36 (L) 08/12/2016 1501   CHOLHDL 2.8 08/12/2016 1501   VLDL 27 08/12/2016 1501   LDLCALC 36 08/12/2016 1501     Wt Readings from Last 3 Encounters:  09/04/16 261 lb 12.8 oz (118.8 kg)  09/03/16 261 lb 9.6 oz (118.7 kg)  08/28/16 267 lb 9.6 oz (121.4 kg)     Other studies Reviewed: Additional studies/ records that were reviewed today include: hospital records and testing.  ASSESSMENT AND PLAN:  1.  Chronic diastolic CHF: Advised pt volume status good. OK to lose actual weight, but does not need to lose any more fluid. He will look for ways to make low Na food  taste better, understands that keeping the fluid off keeps him out of the hospital. Advised him to drink about 1-1.5 L daily. Watch restaurant foods, lots of hidden salt. Encouraged him to increase activity.   No med changes, ck BMET today and f/u w/ MD in 2 mos. Call if he rapidly gains weight.  2. Persistent atrial fib: HR ok here, no sx at home. Pt was told to call us if he gets dizzy, etc. Continue Coreg  3. Chronic anticoagulation: CHA2DS2VASc=4 (age x 2, CHF, HTN). Continue Eliquis  4. HTN: BP well-controlled today, continue rx,  Pt aware high BP can contribute to his CHF.   Current medicines are reviewed at length with the patient today.  The patient does not have concerns regarding medicines.  The following changes have been made:  no change  Labs/ tests ordered today include:   Orders Placed This Encounter  Procedures  . Basic metabolic panel  . EKG 12-Lead     Disposition:   FU with Dr Duke Salviaandolph  Signed, Theodore DemarkBarrett, Chanise Habeck, PA-C  09/04/2016 10:07 AM    [facility] Phone: [phone]; Fax: [phone]  This note was written with the assistance of speech recognition software. Please excuse any transcriptional errors.

## 2016-09-04 NOTE — Patient Instructions (Signed)
Medication Instructions:  Your physician recommends that you continue on your current medications as directed. Please refer to the Current Medication list given to you today.  If you need a refill on your cardiac medications before your next appointment, please call your pharmacy.  Labwork: BMET TODAY AT [facility]  Testing/Procedures: NONE  Follow-Up: Your physician recommends that you schedule a follow-up appointment in: 2 MONTHS WITH DR [facility]   HAPPY NEW YEAR!   Thank you for choosing [facility] at [facility]!!    Michelle, LPN

## 2016-09-10 ENCOUNTER — Other Ambulatory Visit: Payer: Self-pay | Admitting: *Deleted

## 2016-09-10 ENCOUNTER — Ambulatory Visit (INDEPENDENT_AMBULATORY_CARE_PROVIDER_SITE_OTHER): Payer: Medicare Other | Admitting: Family Medicine

## 2016-09-10 ENCOUNTER — Encounter: Payer: Self-pay | Admitting: Family Medicine

## 2016-09-10 DIAGNOSIS — I5032 Chronic diastolic (congestive) heart failure: Secondary | ICD-10-CM | POA: Diagnosis not present

## 2016-09-10 DIAGNOSIS — I872 Venous insufficiency (chronic) (peripheral): Secondary | ICD-10-CM

## 2016-09-10 DIAGNOSIS — R1030 Lower abdominal pain, unspecified: Secondary | ICD-10-CM

## 2016-09-10 DIAGNOSIS — Z79899 Other long term (current) drug therapy: Secondary | ICD-10-CM

## 2016-09-10 MED ORDER — ENALAPRIL MALEATE 20 MG PO TABS: 20.0000 mg | ORAL_TABLET | Freq: Every day | ORAL | 3 refills | Status: AC

## 2016-09-10 NOTE — Assessment & Plan Note (Addendum)
Best guess dry wt is 260 lbs 09/10/16.  Remaining edema likely due to venous insufficiency.

## 2016-09-10 NOTE — Patient Instructions (Signed)
You seem to be doing very well. Stay on all the same medicines.  Do not let any run out. See me in six weeks. Continue to weigh yourself every day. Call me if the weight starts creeping up.

## 2016-09-12 NOTE — Assessment & Plan Note (Signed)
Will not push diuresis at this point.

## 2016-09-12 NOTE — Assessment & Plan Note (Signed)
Committed to long term weight loss.

## 2016-09-12 NOTE — Progress Notes (Signed)
   Subjective:    Patient ID: Shane FearingAnthony H Demicco, male    DOB: Sep 05, 1941, 76 y.o.   MRN: [id number]  HPI FU abd pain.  See Dr. Natale MilchLancaster note of 12/27.  He has now been on a bowel regimen for several days and having normal BMs.  His pain has resolved.  Response is good confirmation of clinical dx of constipation.  Other issues. He is weighing daily.  Seems to have stabilized at about 260 per our scales.  I consider this his new dry weight.  Much better ability to move without dyspnea. HBP is well controled.  Lowish reading today prompts me to ask about lightheaded spells - he denies. Obesity.  The recent hospitalization has been a wake up call.  Eating healthier diet and more active.  We hope over time he loses not just fluid but also fat weight.    Review of Systems     Objective:   Physical ExamLungs clear, no rales Cardiac RRR with 2/6 SEM Abd benign Ext bilateral 1+ edema.        Assessment & Plan:

## 2016-09-12 NOTE — Assessment & Plan Note (Signed)
Dx is constipation and problem resolved.

## 2016-09-20 LAB — BASIC METABOLIC PANEL
BUN: 18 mg/dL (ref 7–25)
CHLORIDE: 103 mmol/L (ref 98–110)
CO2: 25 mmol/L (ref 20–31)
CREATININE: 1.22 mg/dL — AB (ref 0.70–1.18)
Calcium: 8.7 mg/dL (ref 8.6–10.3)
GLUCOSE: 177 mg/dL — AB (ref 65–99)
Potassium: 4.6 mmol/L (ref 3.5–5.3)
Sodium: 136 mmol/L (ref 135–146)

## 2016-09-25 ENCOUNTER — Other Ambulatory Visit: Payer: Self-pay | Admitting: Family Medicine

## 2016-09-28 ENCOUNTER — Other Ambulatory Visit: Payer: Self-pay | Admitting: Family Medicine

## 2016-09-29 ENCOUNTER — Telehealth: Payer: Self-pay | Admitting: *Deleted

## 2016-09-29 NOTE — Telephone Encounter (Signed)
Pt wants to know if dr could send him in something for his gout flare up. Please advise. Shane Mathews Bruna PotterBlount, CMA

## 2016-09-29 NOTE — Telephone Encounter (Signed)
Prior Authorization received from [facility]  for Uloric 40 mg. PA form placed in provider box for completion. [name], Daniele Yankowski L, RN

## 2016-09-29 NOTE — Telephone Encounter (Signed)
PA completed online at www.[email or web address].  PA approved for 30 day supply per insurance.  Approval valid 09/29/16-09/29/17.  [name], Lanyla Costello L, RN

## 2016-09-29 NOTE — Telephone Encounter (Signed)
Form completed and given to [facility] RN

## 2016-09-30 ENCOUNTER — Other Ambulatory Visit: Payer: Self-pay | Admitting: *Deleted

## 2016-09-30 ENCOUNTER — Telehealth: Payer: Self-pay | Admitting: Family Medicine

## 2016-09-30 MED ORDER — SPIRONOLACTONE 50 MG PO TABS: 50.0000 mg | ORAL_TABLET | Freq: Every day | ORAL | 3 refills | Status: DC

## 2016-09-30 MED ORDER — PREDNISONE 20 MG PO TABS: 40.0000 mg | ORAL_TABLET | Freq: Every day | ORAL | 0 refills | Status: AC

## 2016-09-30 NOTE — Telephone Encounter (Signed)
Pt called because he is having trouble walking. He thinks that this is fluid build up or gout. He would like to speak to the doctor about this and see what he should do. jw

## 2016-09-30 NOTE — Telephone Encounter (Signed)
Feels like gout in both ankles and knees.  "I got off my diet and I'm now back on it."  Some swelling.  Has not weighed in 3 days due to pain.   Will treat as gout with prednisone.  Weigh tomorrow morning and call with results.  I want to make sure no worsening of CHF.

## 2016-10-09 ENCOUNTER — Ambulatory Visit: Payer: Medicare Other | Admitting: Family Medicine

## 2016-10-22 ENCOUNTER — Telehealth: Payer: Self-pay | Admitting: Family Medicine

## 2016-10-22 NOTE — Telephone Encounter (Signed)
Contacted [name] and gave her the verbal order under Dr. Leveda AnnaHensel name per Dr. Donnetta HailNeal's note below. Lamonte SakaiZimmerman Rumple, April D, [place]

## 2016-10-22 NOTE — Telephone Encounter (Signed)
Dear Cliffton AstersWhite Team Ok to give verbal recert---give it in Dr Cyndia SkeetersHensel's name so the paperwork comes to him [facility]! Denny LevySara Shreeya Recendiz

## 2016-10-22 NOTE — Telephone Encounter (Signed)
[facility]: needs verbal orders to re certify for nursing and PT for re-evaluation.

## 2016-10-23 NOTE — Telephone Encounter (Signed)
Noted and agree. 

## 2016-10-27 ENCOUNTER — Telehealth: Payer: Self-pay | Admitting: Family Medicine

## 2016-10-27 ENCOUNTER — Telehealth: Payer: Self-pay

## 2016-10-27 MED ORDER — OSELTAMIVIR PHOSPHATE 75 MG PO CAPS: 75.0000 mg | ORAL_CAPSULE | Freq: Every day | ORAL | 0 refills | Status: DC

## 2016-10-27 NOTE — Telephone Encounter (Signed)
Called PT and gave verbal order to continue home treatment.  Called patient.  Yes, he is having gout.  Encouraged him to refill uloric to be active.  He is not a candidate for allopurinol due to allergy.

## 2016-10-27 NOTE — Telephone Encounter (Signed)
Pt Granddaughter has tested positive for the flu. Pediatrician told him to go ahead an get on Tamiflu. Will you please call in an Rx. Sunday SpillersSharon T Saunders, CMA

## 2016-10-27 NOTE — Telephone Encounter (Signed)
Threasa AlphaJim Hoffman, Physical Therapist with [facility].  Pt had physical therapy evaluation today.  Physical therapy is recommended for twice a week for 2 weeks and once a week for 3 weeks for functional mobility and fall risk reduction.  [name] is hoping for a call back in 1-2 days so he could go back out to patient's home later this week.  Patient reported pain level 10/10 with ambulation and pain located bilateral extremties.  Patient is refusing to walk at this time due to the pain.  Patient is working on getting a lift and use of a wheelchair.  Please give him a call at [phone].  [name], Delailah Spieth L, RN

## 2016-10-27 NOTE — Telephone Encounter (Signed)
Pt has been exposed to the flu and would like to have Tamiflu called in. Pt uses [facility]. Pt is also concerned about uloricm pt states there is a class action law suit and wants to know if it is safe to take and if so please refill it and call him to discuss this med. Pt states the PT guy came this morning and they had a great session and PT needs permission from PCP to come back. Please advise. ep

## 2016-10-27 NOTE — Telephone Encounter (Signed)
Called.  Household contact (granddaughter) with confirmed flu.  Will prophylax

## 2016-10-29 ENCOUNTER — Ambulatory Visit: Payer: Medicare Other | Admitting: Family Medicine

## 2016-10-30 ENCOUNTER — Telehealth: Payer: Self-pay | Admitting: *Deleted

## 2016-10-30 NOTE — Telephone Encounter (Signed)
Threasa AlphaJim Hoffman, physical Therapist with [facility] called to follow up regarding the pain was having at last visit.  He was at patient's home today asked patient if any medication was sent in to pharmacy to help with pain.  Per [name], patient stated he did not hear back from PCP regarding any medications.  Ardelle BallsAdvised [name], that PCP spoke with on 10-27-16 and pt reported gout flare and patient was advised by Dr. Leveda AnnaHensel to have uloric Rx filled.  [name] reported that patient did not remember speaking with PCP on Monday.  [name] is concerned about patient's cognitive level.  Will forward to PCP. Please advise.  [name]'s phone [phone].  [name], Tamika L, RN

## 2016-11-03 ENCOUNTER — Ambulatory Visit: Payer: Medicare Other | Admitting: Cardiovascular Disease

## 2016-11-04 ENCOUNTER — Telehealth: Payer: Self-pay | Admitting: *Deleted

## 2016-11-04 NOTE — Telephone Encounter (Signed)
Shane Mathews, Physical Therapist with [facility] called to report home visit today.  Shane Mathews reported 3 bowel movements and upset stomach; 1 or more was diarrhea.  Shane Mathews denied vomiting and fever.  Other vital signs were normal. Shane Mathews also reported having contact to someone that lived with him had the flu which he was given Rx for Tamiflu.  Shane Mathews is requesting a current medication list be faxed to [phone] or give him a call [phone] regarding pain meds.  Shane Mathews told him he was taking Tramadol with good relief in the  Hospital but did not have any at home. Shane Mathews also reported taking Ibuprofen, but that is not on med list. [name], Sayeed Weatherall L, RN

## 2016-11-10 MED ORDER — TRAMADOL HCL 50 MG PO TABS: 50.0000 mg | ORAL_TABLET | Freq: Four times a day (QID) | ORAL | 3 refills | Status: DC | PRN

## 2016-11-10 NOTE — Telephone Encounter (Signed)
Called patient.  He is getting a lift and will be in to see me soon.  Will refill the tramadol.  He is taking uloric.  Allergic to allopurinol.  At next visit, see if I can back off the diuretic some.

## 2016-11-11 NOTE — Telephone Encounter (Signed)
Pt states [facility] does not have the prescription. Pt would like it sent to [facility]. ep

## 2016-11-11 NOTE — Telephone Encounter (Signed)
Tramadol 50 mg tablet: 1 tablet every 6 hours as needed for moderate pain; #100, 3 refills called into [facility].  [facility] did not have Rx for Tramadol.  [name], Tamika L, RN

## 2016-11-12 ENCOUNTER — Other Ambulatory Visit: Payer: Self-pay | Admitting: Family Medicine

## 2016-12-10 ENCOUNTER — Encounter: Payer: Self-pay | Admitting: Family Medicine

## 2016-12-10 ENCOUNTER — Ambulatory Visit (INDEPENDENT_AMBULATORY_CARE_PROVIDER_SITE_OTHER): Payer: Medicare Other | Admitting: Family Medicine

## 2016-12-10 DIAGNOSIS — M109 Gout, unspecified: Secondary | ICD-10-CM | POA: Diagnosis not present

## 2016-12-10 DIAGNOSIS — M1000 Idiopathic gout, unspecified site: Secondary | ICD-10-CM | POA: Diagnosis not present

## 2016-12-10 DIAGNOSIS — H6123 Impacted cerumen, bilateral: Secondary | ICD-10-CM

## 2016-12-10 DIAGNOSIS — I5032 Chronic diastolic (congestive) heart failure: Secondary | ICD-10-CM

## 2016-12-10 MED ORDER — PROBENECID 500 MG PO TABS: 500.0000 mg | ORAL_TABLET | Freq: Two times a day (BID) | ORAL | 12 refills | Status: AC

## 2016-12-10 MED ORDER — PROBENECID 500 MG PO TABS: 500.0000 mg | ORAL_TABLET | Freq: Two times a day (BID) | ORAL | 12 refills | Status: DC

## 2016-12-10 NOTE — Patient Instructions (Signed)
Let me guess that your current best "dry weight" is 255 lbs. I want you to dose your lasix/furosemide as follows: Wt less than 252 no lasix. Wt between 252-258 one lasix. Wt above 258 one lasix twice a day.   I added an old drug for gout, probenicid Hopefully having less lasix and the new medicine will control the gout.  If no better in one month, see me and I will add another medication.   Everything else looks good.

## 2016-12-10 NOTE — Assessment & Plan Note (Signed)
Irrigated both clear.  He will decide if still needs audiology referral.

## 2016-12-10 NOTE — Assessment & Plan Note (Signed)
Overdiuresis may be contributing to gout. Try to decrease diuretic.  See schedule on AVS.

## 2016-12-10 NOTE — Assessment & Plan Note (Signed)
Add probenicid and decrease diuretic.  Add newer agent if these two interventions fail.

## 2016-12-10 NOTE — Progress Notes (Signed)
   Subjective:    Patient ID: Shane Mathews, male    DOB: September 05, 1941, 76 y.o.   MRN: [id number]  HPI Multiple issues: 1. His major complaint is gout.  Both great toes and right ankle.  Has been having low grade pain since discharge in Jan with intermitant severe flairs which might also affect the knees.  Taking uroloric.  Needs to be on diuretic for his CHF.  Cannot tolerate allopurinol. 2. CHF.  Dry weight was thought to be 260 at DC in Jan 2018.  Has been on a very healthy diet and may lost some fat weight.  No SOB.  Not very active because of difficulty ambulating with gout.   3. Difficulty hearing.    Review of Systems     Objective:   Physical Exam Bilateral cerumen impaction, irrigated clear.  Hears better subjectively Lungs clear Ext some right ankle tenderness. Bilateral great toe MTP joint tenderness. No ankle edema.        Assessment & Plan:

## 2016-12-18 ENCOUNTER — Other Ambulatory Visit: Payer: Self-pay

## 2016-12-18 ENCOUNTER — Telehealth: Payer: Self-pay | Admitting: Cardiology

## 2016-12-18 NOTE — Telephone Encounter (Signed)
Returned call to [employer] PT.He stated he needed a copy of patient's medication list.Advised on 09/19/16 patient's spironolactone 50 mg dose was decreased to 25 mg daily.Medication list faxed to Rosanne Ashing at fax # [phone].

## 2016-12-18 NOTE — Telephone Encounter (Signed)
Pt reports taking Spironolactone 11/2 tablet of 50 mg for a total of . Please confirm that this is correct and please send his medicine list. Please fax medicine list to [phone]. Thank you very much.

## 2016-12-22 ENCOUNTER — Encounter: Payer: Self-pay | Admitting: Physician Assistant

## 2016-12-22 ENCOUNTER — Ambulatory Visit (INDEPENDENT_AMBULATORY_CARE_PROVIDER_SITE_OTHER): Payer: Medicare Other | Admitting: Physician Assistant

## 2016-12-22 ENCOUNTER — Ambulatory Visit (INDEPENDENT_AMBULATORY_CARE_PROVIDER_SITE_OTHER): Payer: Medicare Other | Admitting: Family Medicine

## 2016-12-22 VITALS — BP 158/72 | HR 88 | Ht 72.0 in | Wt 249.2 lb

## 2016-12-22 VITALS — BP 132/58 | HR 82 | Temp 97.9°F | Wt 249.2 lb

## 2016-12-22 DIAGNOSIS — M1009 Idiopathic gout, multiple sites: Secondary | ICD-10-CM

## 2016-12-22 DIAGNOSIS — M109 Gout, unspecified: Secondary | ICD-10-CM

## 2016-12-22 DIAGNOSIS — I481 Persistent atrial fibrillation: Secondary | ICD-10-CM

## 2016-12-22 DIAGNOSIS — I10 Essential (primary) hypertension: Secondary | ICD-10-CM

## 2016-12-22 DIAGNOSIS — I5032 Chronic diastolic (congestive) heart failure: Secondary | ICD-10-CM | POA: Diagnosis not present

## 2016-12-22 DIAGNOSIS — I4819 Other persistent atrial fibrillation: Secondary | ICD-10-CM

## 2016-12-22 MED ORDER — PREDNISONE 20 MG PO TABS: 40.0000 mg | ORAL_TABLET | Freq: Every day | ORAL | 0 refills | Status: DC

## 2016-12-22 NOTE — Progress Notes (Signed)
Cardiology Office Note   Date:  12/22/2016   ID:  Shane Mathews, DOB [birth year], MRN [id number]  PCP:  Sanjuana Letters, MD  Cardiologist:  Dr. Duke Salvia, saw in hospital 08/2016  Leanna Battles 09/04/2016  Chief Complaint  Patient presents with  . Follow-up    History of Present Illness: Shane Mathews is a 76 y.o. male with a history of PAD, HTN, HLD, OSA, gout, venous insufficiency, anxiety, RBBB. 08/2016  D-CHF, pleural effusion, persistent afib. Started on Eliquis and carvedilol. Metoprolol was pta med, d/c'd due to bradycardia.   Shane Mathews presents for cardiology follow up.   He saw Dr Leveda Anna on 04/04, Lasix was made PRN, weight-based. He was also having problems with gout. Probenecid was added for that.  Since then, his gout symptoms have worsened. He is compliant with his other medications. His daughter is present with him today and notes that many of the medications he takes daily are associated with gout flares.  He has not had fevers or chills. The pain is worst in his R foot and R hand. Both are swollen and reddened. He usually just has pain in his R big toe, this is much worse.  He feels his breathing is at baseline. However, because of the gout flare, his activity level has been poorer than usual. He has not had chest pain or palpitations. He has not had LE edema, except in his R foot and ankle. He has not had any acute (on chronic) orthopnea or PND.   Past Medical History:  Diagnosis Date  . Acute on chronic diastolic heart failure (HCC) 08/14/2016  . Cataract   . Gout   . Hypertension   . Hypertensive heart disease 08/14/2016    Past Surgical History:  Procedure Laterality Date  . KNEE CARTILAGE SURGERY     left    Current Outpatient Prescriptions  Medication Sig Dispense Refill  . amLODipine (NORVASC) 10 MG tablet Take 10 mg by mouth daily.    Marland Kitchen atorvastatin (LIPITOR) 40 MG tablet TAKE ONE TABLET AT 6PM 90 tablet 3  .  carvedilol (COREG) 6.25 MG tablet TAKE ONE TABLET TWICE DAILY WITH A MEAL 180 tablet 3  . docusate sodium (COLACE) 100 MG capsule Take 100 mg by mouth 2 (two) times daily.    Marland Kitchen ELIQUIS 5 MG TABS tablet TAKE ONE TABLET TWICE DAILY 180 tablet 3  . enalapril (VASOTEC) 20 MG tablet Take 1 tablet (20 mg total) by mouth daily. 90 tablet 3  . furosemide (LASIX) 40 MG tablet TAKE ONE TABLET EVERY DAY 90 tablet 3  . hydrALAZINE (APRESOLINE) 100 MG tablet TAKE ONE TABLET EVERY EIGHT HOURS 270 tablet 3  . LORazepam (ATIVAN) 0.5 MG tablet Take 1 tablet (0.5 mg total) by mouth every 4 (four) hours as needed for anxiety (panic attack). 30 tablet 0  . PARoxetine (PAXIL) 20 MG tablet Take 1 tablet (20 mg total) by mouth daily. 30 tablet 1  . probenecid (BENEMID) 500 MG tablet Take 1 tablet (500 mg total) by mouth 2 (two) times daily. For gout 60 tablet 12  . spironolactone (ALDACTONE) 50 MG tablet Take 1/2 tablet 25 mg daily 30 tablet 6  . traMADol (ULTRAM) 50 MG tablet Take 1 tablet (50 mg total) by mouth every 6 (six) hours as needed for moderate pain. 100 tablet 3  . triamcinolone ointment (KENALOG) 0.1 % Apply topically 2 (two) times daily. 30 g 0  . ULORIC 40 MG tablet TAKE  ONE TABLET EACH DAY 90 tablet 3   No current facility-administered medications for this visit.     Allergies:   Asa [aspirin]; Allopurinol; and Other    Social History:  The patient  reports that he quit smoking about 25 years ago. He has never used smokeless tobacco. He reports that he does not drink alcohol or use drugs.   Family History:  The patient's family history includes Cancer in his mother; Osteoporosis in his mother.    ROS:  Please see the history of present illness. All other systems are reviewed and negative.    PHYSICAL EXAM: VS:  BP (!) 158/72   Pulse 88   Ht 6' (1.829 m)   Wt 249 lb 3.2 oz (113 kg)   BMI 33.80 kg/m  , BMI Body mass index is 33.8 kg/m. GEN: Well nourished, well developed, male in no acute  distress  HEENT: normal for age  Neck: no JVD, no carotid bruit, no masses Cardiac: Irreg R&R; no murmur, no rubs, or gallops Respiratory: decreased BS bases bilaterally, normal work of breathing GI: soft, nontender, nondistended, + BS MS: no deformity or atrophy; + edema R hand and R foot; distal pulses are 2+ in all 4 extremities   Skin: warm and dry, no rash; Erythema R hand and foot Neuro:  Strength and sensation are intact Psych: euthymic mood, full affect   EKG:  EKG is not ordered today.  Recent Labs: 08/12/2016: ALT 14; B Natriuretic Peptide 498.9 08/13/2016: Magnesium 2.1 08/14/2016: TSH 2.080 08/21/2016: Hemoglobin 11.0; Platelets 265 09/19/2016: BUN 18; Creat 1.22; Potassium 4.6; Sodium 136    Lipid Panel    Component Value Date/Time   CHOL 99 08/12/2016 1501   TRIG 137 08/12/2016 1501   HDL 36 (L) 08/12/2016 1501   CHOLHDL 2.8 08/12/2016 1501   VLDL 27 08/12/2016 1501   LDLCALC 36 08/12/2016 1501     Wt Readings from Last 3 Encounters:  12/22/16 249 lb 3.2 oz (113 kg)  12/10/16 248 lb 3.2 oz (112.6 kg)  09/10/16 260 lb 9.6 oz (118.2 kg)     Other studies Reviewed: Additional studies/ records that were reviewed today include: office notes, hospital records and testing..   ASSESSMENT AND PLAN:  1.  Chronic diastolic CHF: His weight is stable, he reports no change in symptoms. He has not needed the Lasix recently. Because of the severity of his gout attack, I will stop the spironolactone. Continue to watch the sodium and daily weights.  2. HTN: He has not had his am medications. They will go back to the house and get them. He needs to take the Eliquis, and BP meds including Norvasc, Coreg, hydralazine. OK to hold the enalapril today.   3. Gout: We contacted Dr Cyndia Skeeters office and pt can be seen today at 1:45 pm. To help the gout, d/c spironolactone. Lasix is prn. OK to hold the ACE inhibitor if that will help. Otherwise, per Dr Leveda Anna.  4. Atrial fib: his heart  rate is controlled, continue Coreg  Current medicines are reviewed at length with the patient today.  The patient has concerns regarding medicines. Concerns were addressed  The following changes have been made:  D/c spiro  Labs/ tests ordered today include: BMET   Disposition:   FU with Dr Duke Salvia  Signed, Theodore Demark, PA-C  12/22/2016 11:42 AM    [facility] Phone: [phone]; Fax: [phone]  This note was written with the assistance of speech  recognition software. Please excuse any transcriptional errors.

## 2016-12-22 NOTE — Progress Notes (Signed)
Subjective: CC: gout flare [id number] H Salminen is a 76 y.o. male presenting to clinic today for same day appointment.  He is accompanied by his daughter and son to appointment. PCP: Sanjuana Letters, MD Concerns today include:  1. Gout flare Patient seen 12/10/16.  Provider at that time was concerned that patient's gout flare was related to over-diuresis.  He decreased his Lasix and added Probenecid.  Patient reports that his right third MCP joint started to become red and inflamed over the last 2-3 days.  He reports that the left third DIP started yesterday.  He reports that the knees bother him intermittently.  He noticed minimal improvement after the change in medications at last visit.  His daughter notes that he has not been very mobile over the last 3 months.  He is working with PT but she notes that over the last few weeks he has been less mobile.  Normally is able to ambulate with walker but since the pain has been flared over last few weeks he finds it hard to even get up to go to the restroom.  Patient reports that he saw his cardiologist today.  Spironolactone is discontinued and his dry weight is now set at 250 lb.  No fevers, chills, nausea, vomiting.  He reports that he responded to Prednisone well in the past.  Last time he had prednisone was 09/10/2016.   Allergies  Allergen Reactions  . Asa [Aspirin] Shortness Of Breath and Other (See Comments)    Wheezing also (childhood allergy)  . Allopurinol Other (See Comments)    Unknown reaction  . Other Other (See Comments)    Develops migraines by strong smells    Social Hx reviewed. MedHx, current medications and allergies reviewed.  Please see EMR. ROS: Per HPI  Objective: Office vital signs reviewed. BP (!) 132/58   Pulse 82   Temp 97.9 F (36.6 C) (Oral)   Wt 249 lb 3.2 oz (113 kg)   SpO2 99%   BMI 33.80 kg/m   Physical Examination:  General: Awake, alert, well nourished, nontoxic appearing male, No acute  distress HEENT: Normal    Eyes: EOMI, sclera white Cardio: regular rate and rhythm, S1S2 heard, no murmurs appreciated Pulm: clear to auscultation bilaterally, no wheezes, rhonchi or rales; normal work of breathing on room air MSK: arrives in wheelchair, right third MCP with swelling and mild overlying erythema.  AROM decreased 2/2 swelling.  Mild TTP to joint.  Mild increased warmth.  Left third DIP with slight erythema and minimal swelling.  Bilateral knees with bony changes suggestive of OA.    No results found for this or any previous visit (from the past 24 hour(s)).  Assessment/ Plan: 76 y.o. male   1. Acute gouty arthritis.  Appears to be flare of gout.  Other DDx to consider is septic joint.  I discussed at length with patient and family members reasons to seek reevaluation.  His spironolactone was discontinued by Cards today.  This may help reduce flares.  Will treat with steroid burst for acute flare.  May require longer steroid taper.  I advised patient to schedule an appointment if he did not see resolution by day 5 of treatment. - Uric Acid - POCT SEDIMENTATION RATE - predniSONE (DELTASONE) 20 MG tablet; Take 2 tablets (40 mg total) by mouth daily with breakfast.  Dispense: 14 tablet; Refill: 0 - CBC with Differential/Platelet - Follow up with Dr Leveda Anna as scheduled or sooner if the need arises.  Janora Norlander, DO PGY-3, [facility]

## 2016-12-22 NOTE — Patient Instructions (Signed)
Medication Instructions:  STOP SPIRONOLACTONE CONTINUE LASIX AS NEEDED FOR WEIGHT > 250 LBS   If you need a refill on your cardiac medications before your next appointment, please call your pharmacy.  Labwork: BMET TODAY AT [facility] AT DR HENSEL'S OFFICE  Follow-Up: Your physician wants you to follow-up in: 2 MONTHS WITH DR [facility]   Special Instructions: APPT TODAY WITH DR HENSEL 1:45PM-DISCUSS GOUT AND LABS-BMP    Thank you for choosing [facility] at [facility]!!    RHONDA BARRETT, PA-C Marcelino Duster, LPN

## 2016-12-22 NOTE — Patient Instructions (Addendum)
Keep your appointment with Dr Leveda Anna.  I have sent in prednisone to your pharmacy.  You may require longer than a 1 week treatment.  If you are still having significant swelling and pain by day 5 of treatment schedule an appointment to be seen.  A taper will be discussed with you at that time.  If the swelling in your hand becomes worse, you develop fever, the redness starts to extend beyond the hand or your symptoms do not respond to the medication, please come back for reevaluation.  I will contact you will the results of your labs.  If anything is abnormal, I will call you.  Otherwise, expect a copy to be mailed to you.   Gout Gout is painful swelling that can happen in some of your joints. Gout is a type of arthritis. This condition is caused by having too much uric acid in your body. Uric acid is a chemical that is made when your body breaks down substances called purines. If your body has too much uric acid, sharp crystals can form and build up in your joints. This causes pain and swelling. Gout attacks can happen quickly and be very painful (acute gout). Over time, the attacks can affect more joints and happen more often (chronic gout). Follow these instructions at home: During a Gout Attack   If directed, put ice on the painful area:  Put ice in a plastic bag.  Place a towel between your skin and the bag.  Leave the ice on for 20 minutes, 2-3 times a day.  Rest the joint as much as possible. If the joint is in your leg, you may be given crutches to use.  Raise (elevate) the painful joint above the level of your heart as often as you can.  Drink enough fluids to keep your pee (urine) clear or pale yellow.  Take over-the-counter and prescription medicines only as told by your doctor.  Do not drive or use heavy machinery while taking prescription pain medicine.  Follow instructions from your doctor about what you can or cannot eat and drink.  Return to your normal activities as told  by your doctor. Ask your doctor what activities are safe for you. Avoiding Future Gout Attacks   Follow a low-purine diet as told by a specialist (dietitian) or your doctor. Avoid foods and drinks that have a lot of purines, such as:  Liver.  Kidney.  Anchovies.  Asparagus.  Herring.  Mushrooms  Mussels.  Beer.  Limit alcohol intake to no more than 1 drink a day for nonpregnant women and 2 drinks a day for men. One drink equals 12 oz of beer, 5 oz of wine, or 1 oz of hard liquor.  Stay at a healthy weight or lose weight if you are overweight. If you want to lose weight, talk with your doctor. It is important that you do not lose weight too fast.  Start or continue an exercise plan as told by your doctor.  Drink enough fluids to keep your pee clear or pale yellow.  Take over-the-counter and prescription medicines only as told by your doctor.  Keep all follow-up visits as told by your doctor. This is important. Contact a doctor if:  You have another gout attack.  You still have symptoms of a gout attack after10 days of treatment.  You have problems (side effects) because of your medicines.  You have chills or a fever.  You have burning pain when you pee (urinate).  You  have pain in your lower back or belly. Get help right away if:  You have very bad pain.  Your pain cannot be controlled.  You cannot pee. This information is not intended to replace advice given to you by your health care provider. Make sure you discuss any questions you have with your health care provider. Document Released: 06/03/2008 Document Revised: 01/31/2016 Document Reviewed: 06/07/2015 Elsevier Interactive Patient Education  2017 [employer].

## 2016-12-23 LAB — CBC WITH DIFFERENTIAL/PLATELET
Basophils Absolute: 0 10*3/uL (ref 0.0–0.2)
Basos: 0 %
EOS (ABSOLUTE): 0.2 10*3/uL (ref 0.0–0.4)
EOS: 1 %
HEMATOCRIT: 32.1 % — AB (ref 37.5–51.0)
HEMOGLOBIN: 10.7 g/dL — AB (ref 13.0–17.7)
IMMATURE GRANS (ABS): 0 10*3/uL (ref 0.0–0.1)
IMMATURE GRANULOCYTES: 0 %
LYMPHS: 6 %
Lymphocytes Absolute: 0.7 10*3/uL (ref 0.7–3.1)
MCH: 24.3 pg — AB (ref 26.6–33.0)
MCHC: 33.3 g/dL (ref 31.5–35.7)
MCV: 73 fL — AB (ref 79–97)
Monocytes Absolute: 0.9 10*3/uL (ref 0.1–0.9)
Monocytes: 7 %
NEUTROS ABS: 10.6 10*3/uL — AB (ref 1.4–7.0)
NEUTROS PCT: 86 %
Platelets: 536 10*3/uL — ABNORMAL HIGH (ref 150–379)
RBC: 4.41 x10E6/uL (ref 4.14–5.80)
RDW: 16 % — ABNORMAL HIGH (ref 12.3–15.4)
WBC: 12.3 10*3/uL — ABNORMAL HIGH (ref 3.4–10.8)

## 2016-12-23 LAB — SEDIMENTATION RATE: Sed Rate: 90 mm/hr — ABNORMAL HIGH (ref 0–30)

## 2016-12-23 LAB — URIC ACID: Uric Acid: 2.7 mg/dL — ABNORMAL LOW (ref 3.7–8.6)

## 2016-12-24 ENCOUNTER — Telehealth: Payer: Self-pay | Admitting: Family Medicine

## 2016-12-24 NOTE — Telephone Encounter (Signed)
Spoke to patient regarding lab results, which revealed a slight elevation in WBC and an elevated Platelet.  ESR elevated.  Uric acid level surprisingly is on the lower side.  He reports that swelling has improved tremendously.  He notes improvement in his right hand AROM but notes that he does have some continued swelling in that hand.  No fevers, chills.  Overall, reports things are looking better.    I reminded him that if symptoms are not almost resolved by Friday, he should schedule an appointment for reevaluation on Friday or Monday.  He may require a longer taper of steroid.  Additionally, we reviewed that infection can sometimes be confused with gout, so if he were to develop worsening symptoms to seek immediate medical attention.    Ashly M. Gottschalk, DO PGY-3, [facility]     

## 2016-12-29 ENCOUNTER — Ambulatory Visit (INDEPENDENT_AMBULATORY_CARE_PROVIDER_SITE_OTHER): Payer: Medicare Other | Admitting: Family Medicine

## 2016-12-29 ENCOUNTER — Encounter: Payer: Self-pay | Admitting: Family Medicine

## 2016-12-29 VITALS — BP 150/52 | HR 63 | Temp 97.6°F | Ht 72.0 in | Wt 251.0 lb

## 2016-12-29 DIAGNOSIS — M109 Gout, unspecified: Secondary | ICD-10-CM | POA: Diagnosis not present

## 2016-12-29 MED ORDER — PREDNISONE 20 MG PO TABS: ORAL_TABLET | ORAL | 0 refills | Status: DC

## 2016-12-29 NOTE — Patient Instructions (Signed)
I'm glad your gout is doing better! We will extend your steroid and then taper it to prevent recurrence. Please follow up after you have completed your steroids if no improvement or sooner if worsening occurs.  Dr. Caroleen Hamman

## 2016-12-30 ENCOUNTER — Telehealth: Payer: Self-pay | Admitting: *Deleted

## 2016-12-30 NOTE — Telephone Encounter (Addendum)
Rosanne Ashing, Physical Therapist with [facility] called to request verbal order for occupational therapy. OT orders are needed for bathing, toileting and dressing. Patient has shown great improvement in gout pain compared to last week.  Patient still has some gout pain in the right hand.  Patient is also able to get up and move around a lot better.  Please give Rosanne Ashing a call at [phone].  Clovis Pu, RN

## 2016-12-31 NOTE — Telephone Encounter (Signed)
sorry---I missed part of the message evidently YES Ok to verbal orders for OT Sorry for confusion Denny Levy

## 2016-12-31 NOTE — Telephone Encounter (Signed)
Dear Cliffton Asters Team  Dr. Leveda Anna is out--I will copy him on this note--unless there is a specific question there is nothing I can tell Rosanne Ashing as I do not know the patient THANKS! Denny Levy

## 2016-12-31 NOTE — Telephone Encounter (Signed)
Spoke with Rosanne Ashing at [facility]. Verbal OT orders given. Sunday Spillers, CMA

## 2016-12-31 NOTE — Progress Notes (Signed)
Subjective:     Patient ID: Shane Mathews, male   DOB: 1941-03-18, 76 y.o.   MRN: [id number]  HPI Shane Mathews is a 76yo male presenting today for follow up of gout. Multiple recent visits for gout. Seen on 12/10/16 and prescribed probenicid with diuretic decreased. Seen again on 4/16 for gout and prescribed course of prednisone.  Since last office visit, reports significant improvement in symptoms. Gout in bilateral feet has resolved and now with lingering but improved redness, warmth, and pain of right hand. Would like course of steroids extended if possible, with last dose taken today. Reports he was wheelchair bound at his last office visit, but today he is doing so well he is able to ambulate with his walker.  Also of note, he was recently seen by Cardiology and his dry weight was changed to 251pounds. Dose of lasix taken today.  Former smoker.  Review of Systems Per HPI    Objective:   Physical Exam  Constitutional: He appears well-developed and well-nourished. No distress.  Walker  Cardiovascular: Normal rate.   Pulmonary/Chest: Effort normal. No respiratory distress.  Musculoskeletal:  Lingering redness and warmth of right hand. ROM of wrist and fingers symmetric.  Psychiatric: He has a normal mood and affect. His behavior is normal.      Assessment and Plan:     1. Acute gout of right hand, unspecified cause Improved. Extend course of prednisone by an additional 5 days and then taper to prevent recurrence. Follow up if no improvement or worsening occurs.

## 2017-01-07 ENCOUNTER — Telehealth: Payer: Self-pay | Admitting: Family Medicine

## 2017-01-07 DIAGNOSIS — M109 Gout, unspecified: Secondary | ICD-10-CM

## 2017-01-07 NOTE — Telephone Encounter (Signed)
Pt states PCP told him he could get a high priced medication for gout, pt would like to get it regardless of cost. Pt just wants gout to go away. Pt uses [facility]. ep

## 2017-01-08 MED ORDER — FEBUXOSTAT 40 MG PO TABS: ORAL_TABLET | ORAL | 3 refills | Status: AC

## 2017-01-08 NOTE — Telephone Encounter (Signed)
I am not sure of the diagnosis of gout.  Uric acid during last flair was only 2.7.  I believe the best action at this point is a rheumatology referral.  I now doubt the diagnosis of gout and wonder if he has another inflammatory arthritis.  Called patient and left message.

## 2017-01-12 ENCOUNTER — Telehealth: Payer: Self-pay | Admitting: Family Medicine

## 2017-01-12 NOTE — Telephone Encounter (Signed)
Daughter called about the referral to rhemologoist.  She was told dr Zenaida Deeddevershawer could not see pt until July. Because of the pain and lack of mobility,  Pt cant wait until then.  Is there another dr that could see him? Please advise

## 2017-01-13 NOTE — Telephone Encounter (Signed)
Will ask our referral coordinator to explore possibilities.

## 2017-01-14 ENCOUNTER — Ambulatory Visit: Payer: Medicare Other | Admitting: Family Medicine

## 2017-01-15 NOTE — Telephone Encounter (Signed)
[facility] to see how far out their office was scheduling NPs. They are scheduling into June. With Dr. Kellie Simmeringruslow retiring, there are many patients having to be referred to new a rheum. I will send referral to [facility] as June is better than July. Shane Mathews

## 2017-01-16 ENCOUNTER — Telehealth: Payer: Self-pay | Admitting: Family Medicine

## 2017-01-16 NOTE — Telephone Encounter (Signed)
[facility];  Needs to move the visit missed on Monday to this coming week,.  Verbal orders ok

## 2017-01-19 NOTE — Telephone Encounter (Signed)
Given verbal OK.

## 2017-02-25 ENCOUNTER — Encounter: Payer: Self-pay | Admitting: Cardiovascular Disease

## 2017-02-25 ENCOUNTER — Ambulatory Visit (INDEPENDENT_AMBULATORY_CARE_PROVIDER_SITE_OTHER): Payer: Medicare Other | Admitting: Cardiovascular Disease

## 2017-02-25 VITALS — BP 130/60 | HR 71 | Ht 72.0 in | Wt 253.0 lb

## 2017-02-25 DIAGNOSIS — E7800 Pure hypercholesterolemia, unspecified: Secondary | ICD-10-CM | POA: Diagnosis not present

## 2017-02-25 DIAGNOSIS — I10 Essential (primary) hypertension: Secondary | ICD-10-CM

## 2017-02-25 DIAGNOSIS — R0602 Shortness of breath: Secondary | ICD-10-CM

## 2017-02-25 DIAGNOSIS — I481 Persistent atrial fibrillation: Secondary | ICD-10-CM

## 2017-02-25 DIAGNOSIS — I5032 Chronic diastolic (congestive) heart failure: Secondary | ICD-10-CM

## 2017-02-25 DIAGNOSIS — I4819 Other persistent atrial fibrillation: Secondary | ICD-10-CM

## 2017-02-25 NOTE — Patient Instructions (Signed)
Medication Instructions:  Your physician recommends that you continue on your current medications as directed. Please refer to the Current Medication list given to you today.  Labwork: BNP/BMET TODAY   Testing/Procedures: NONE  Follow-Up: Your physician wants you to follow-up in: 6 MONTH OV You will receive a reminder letter in the mail two months in advance. If you don't receive a letter, please call our office to schedule the follow-up appointment.  If you need a refill on your cardiac medications before your next appointment, please call your pharmacy.

## 2017-02-25 NOTE — Progress Notes (Signed)
Cardiology Office Note   Date:  02/25/2017   ID:  Shane Mathews, DOB 12-12-1940, MRN [id number]  PCP:  Shane Mathews, Shane A, MD  Cardiologist:   Shane Siiffany Dixon, MD   No chief complaint on file.     History of Present Illness: Shane Mathews is Mathews 76 y.o. male with chronic diastolic heart failure, hypertension, atrial fibrillation, moderate LVH, PAD and OSA who presents for follow up.  Shane Mathews was first seen 08/2016 when he was hospitalized for new onset atrial fibrillation and acute diastolic heart failure.  Echocardiogram that admission revealed LVEF 60-65% with moderate left ventricular hypertrophy and mild mitral regurgitation. His left atrium was severely enlarged.  He reports that he has been doing well.  His main complaint is gout feet in the ankles, elbows, and right hand.  He changed his diet and denies symptoms for the last 3 weeks.  He denies lower extremity edema, orthopnea or PND.  His weight has been stable between 248-250.  He also denies palpitations, chest pain or shortness of breath.  He has noted Mathews non-productive cough.  He doesn't get much formal exercise but tries to walk as much as he can.  He uses Mathews walker because his balance is poor.  He denies recent falls.    Past Medical History:  Diagnosis Date  . Acute on chronic diastolic heart failure (HCC) 08/14/2016  . Cataract   . Gout   . Hypertension   . Hypertensive heart disease 08/14/2016    Past Surgical History:  Procedure Laterality Date  . KNEE CARTILAGE SURGERY     left     Current Outpatient Prescriptions  Medication Sig Dispense Refill  . amLODipine (NORVASC) 10 MG tablet Take 10 mg by mouth daily.    Marland Kitchen. atorvastatin (LIPITOR) 40 MG tablet TAKE ONE TABLET AT 6PM 90 tablet 3  . carvedilol (COREG) 6.25 MG tablet TAKE ONE TABLET TWICE DAILY WITH Mathews MEAL 180 tablet 3  . docusate sodium (COLACE) 100 MG capsule Take 100 mg by mouth 2 (two) times daily.    Marland Kitchen. ELIQUIS 5 MG TABS tablet TAKE ONE TABLET  TWICE DAILY 180 tablet 3  . enalapril (VASOTEC) 20 MG tablet Take 1 tablet (20 mg total) by mouth daily. 90 tablet 3  . febuxostat (ULORIC) 40 MG tablet TAKE ONE TABLET EACH DAY 90 tablet 3  . furosemide (LASIX) 40 MG tablet TAKE ONE TABLET EVERY DAY 90 tablet 3  . hydrALAZINE (APRESOLINE) 100 MG tablet TAKE ONE TABLET EVERY EIGHT HOURS 270 tablet 3  . LORazepam (ATIVAN) 0.5 MG tablet Take 1 tablet (0.5 mg total) by mouth every 4 (four) hours as needed for anxiety (panic attack). 30 tablet 0  . PARoxetine (PAXIL) 20 MG tablet Take 1 tablet (20 mg total) by mouth daily. 30 tablet 1  . probenecid (BENEMID) 500 MG tablet Take 1 tablet (500 mg total) by mouth 2 (two) times daily. For gout 60 tablet 12  . triamcinolone ointment (KENALOG) 0.1 % Apply topically 2 (two) times daily. 30 g 0   No current facility-administered medications for this visit.     Allergies:   Asa [aspirin]; Allopurinol; and Other    Social History:  The patient  reports that he quit smoking about 26 years ago. He has never used smokeless tobacco. He reports that he does not drink alcohol or use drugs.   Family History:  The patient's  family history includes Cancer in his mother; Osteoporosis in his mother.  ROS:  Please see the history of present illness.   Otherwise, review of systems are positive for none.   All other systems are reviewed and negative.    PHYSICAL EXAM: VS:  BP 130/60   Pulse 71   Ht 6' (1.829 m)   Wt 114.8 kg (253 lb)   SpO2 96%   BMI 34.31 kg/m  , BMI Body mass index is 34.31 kg/m. GENERAL:  Well appearing HEENT:  Pupils equal round and reactive, fundi not visualized, oral mucosa unremarkable NECK:  No jugular venous distention, waveform within normal limits, carotid upstroke brisk and symmetric, no bruits, no thyromegaly LYMPHATICS:  No cervical adenopathy LUNGS:  Breath sounds diminished at the L base HEART:  RRR.  PMI not displaced or sustained,S1 and S2 within normal limits, no S3,  no S4, no clicks, no rubs, no murmurs ABD:  Flat, positive bowel sounds normal in frequency in pitch, no bruits, no rebound, no guarding, no midline pulsatile mass, no hepatomegaly, no splenomegaly EXT:  2 plus pulses throughout, 1+ pitting edema to the lower tibia bilaterally. no cyanosis no clubbing SKIN:  No rashes no nodules NEURO:  Cranial nerves II through XII grossly intact, motor grossly intact throughout PSYCH:  Cognitively intact, oriented to person place and time    EKG:  EKG is ordered today. The ekg ordered today demonstrates atrial fibrillation. Rate 70 bpm. Left axis. Right bundle branch block. Prior inferior infarct.   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  Right TBI of 0.46 and left TBI of 0.47 are suggestive of abnormal arterial flow at rest.   Recent Labs: 08/12/2016: ALT 14; B Natriuretic Peptide 498.9 08/13/2016: Magnesium 2.1 08/14/2016: TSH 2.080 09/19/2016: BUN 18; Creat 1.22; Potassium 4.6; Sodium 136 12/22/2016: Hemoglobin 10.7; Platelets 536    Lipid Panel    Component Value Date/Time   CHOL 99 08/12/2016 1501   TRIG 137 08/12/2016 1501   HDL 36 (L) 08/12/2016 1501   CHOLHDL 2.8 08/12/2016 1501   VLDL 27 08/12/2016 1501   LDLCALC 36 08/12/2016 1501      Wt Readings from Last 3 Encounters:  02/25/17 114.8 kg (253 lb)  12/29/16 113.9 kg (251 lb)  12/22/16 113 kg (249 lb 3.2 oz)      ASSESSMENT AND PLAN:  # Persistent atrial fibrillation:  Rates are well-controlled. He is asymptomatic. Continue carvedilol and Eliquis.   # Chronic diastolic heart failure:  Shane Mathews appears to be volume overloaded. He denies any heart failure symptoms. However he has an effusion and lower extremity edema. We will check Mathews BNP and Mathews BMP today. Continue Lasix, carvedilol, and enalapril.  # Hypertension:  BP is controlled on amlodipine, carvedilol, enalapril and hydralazine.   #  Hyperlipidemia:  Continue atorvastatin.  LDL 36 08/2016.   Current medicines are reviewed at length with the patient today.  The patient does not have concerns regarding medicines.  The following changes have been made:  no change  Labs/ tests ordered today include:   Orders Placed This Encounter  Procedures  . Basic metabolic panel  . Pro b natriuretic peptide (BNP)  . EKG 12-Lead     Disposition:   FU with Amberlynn Tempesta C. Duke Salvia, MD, [facility] in 6 months.     This note was written with the assistance of speech recognition software.  Please excuse any transcriptional errors.  Signed, Latha Staunton C. Duke Salvia, MD, [facility]  02/25/2017 5:06 PM  [facility]

## 2017-02-26 LAB — BASIC METABOLIC PANEL
BUN/Creatinine Ratio: 11 (ref 10–24)
BUN: 11 mg/dL (ref 8–27)
CHLORIDE: 102 mmol/L (ref 96–106)
CO2: 25 mmol/L (ref 20–29)
Calcium: 9.1 mg/dL (ref 8.6–10.2)
Creatinine, Ser: 0.98 mg/dL (ref 0.76–1.27)
GFR calc non Af Amer: 75 mL/min/{1.73_m2} (ref >59)
GFR, EST AFRICAN AMERICAN: 86 mL/min/{1.73_m2} (ref >59)
Glucose: 94 mg/dL (ref 65–99)
POTASSIUM: 3.6 mmol/L (ref 3.5–5.2)
SODIUM: 144 mmol/L (ref 134–144)

## 2017-02-26 LAB — PRO B NATRIURETIC PEPTIDE: NT-Pro BNP: 1084 pg/mL — ABNORMAL HIGH (ref 0–486)

## 2017-02-27 ENCOUNTER — Telehealth: Payer: Self-pay | Admitting: *Deleted

## 2017-02-27 NOTE — Telephone Encounter (Signed)
-----   Message from Chilton Siiffany Seward, MD sent at 02/27/2017 10:15 AM EDT ----- Labs show that he has a heart failure flare.  Increase lasix to 40 mg bid.  Follow up with APP next week and repeat BMP at that time.

## 2017-02-27 NOTE — Telephone Encounter (Signed)
Advised patient of lab results and medication change Scheduled follow up with Bjorn Loserhonda B PA for next week

## 2017-03-04 ENCOUNTER — Encounter: Payer: Self-pay | Admitting: Physician Assistant

## 2017-03-04 ENCOUNTER — Ambulatory Visit (INDEPENDENT_AMBULATORY_CARE_PROVIDER_SITE_OTHER): Payer: Medicare Other | Admitting: Physician Assistant

## 2017-03-04 VITALS — BP 140/68 | HR 64 | Ht 72.0 in | Wt 247.0 lb

## 2017-03-04 DIAGNOSIS — I481 Persistent atrial fibrillation: Secondary | ICD-10-CM | POA: Diagnosis not present

## 2017-03-04 DIAGNOSIS — I4819 Other persistent atrial fibrillation: Secondary | ICD-10-CM

## 2017-03-04 DIAGNOSIS — I10 Essential (primary) hypertension: Secondary | ICD-10-CM | POA: Diagnosis not present

## 2017-03-04 DIAGNOSIS — I5032 Chronic diastolic (congestive) heart failure: Secondary | ICD-10-CM

## 2017-03-04 NOTE — Progress Notes (Signed)
Cardiology Office Note   Date:  03/04/2017   ID:  Shane Mathews, DOB 1940-09-14, MRN [id number]  PCP:  Moses Manners, MD  Cardiologist:  Dr. Duke Salvia, 02/25/2017  Theodore Demark, PA-C 12/22/2016  Chief Complaint  Patient presents with  . Follow-up    History of Present Illness: Shane Mathews is a 76 y.o. male with a history of D-CHF, LVH w/ nl EF by echo 08/2016, PAD, OSA, HTN,  venous insufficiency, anxiety, RBBB. 08/2016 D-CHF, pleural effusion, persistent afib. Started on Eliquis and carvedilol. Metoprolol was pta med, d/c'd due to bradycardia.   06/20 office visit, pt volume overloaded, labs reviewed and Lasix increased to 40 mg twice a day, follow-up appointment and BMET scheduled  Shane Mathews presents for cardiology follow up.  He has lost 6 lbs and feels much better. He is taking Lasix at 40 mg bid. He is paying more attention to the Na in his diet. He is not over-drinking. He still has some dyspnea on exertion but feels comfortable increasing his activity. He denies PND and has some chronic orthopnea. He wears compression socks at times.  He has increased stress right now because his wife was admitted last night with an aneurysm and since she "survived the night" she is to get surgery today. He was up most of the night with her. He has not had his morning medications.  He has not had any chest pain. He has not had palpitations and is not aware of the atrial fibrillation. He has not had any bleeding issues and is tolerating Eliquis well.   Past Medical History:  Diagnosis Date  . Acute on chronic diastolic heart failure (HCC) 08/14/2016  . Cataract   . Gout   . Hypertension   . Hypertensive heart disease 08/14/2016    Past Surgical History:  Procedure Laterality Date  . KNEE CARTILAGE SURGERY     left    Current Outpatient Prescriptions  Medication Sig Dispense Refill  . amLODipine (NORVASC) 10 MG tablet Take 10 mg by mouth daily.    Marland Kitchen  atorvastatin (LIPITOR) 40 MG tablet TAKE ONE TABLET AT 6PM 90 tablet 3  . carvedilol (COREG) 6.25 MG tablet TAKE ONE TABLET TWICE DAILY WITH A MEAL 180 tablet 3  . docusate sodium (COLACE) 100 MG capsule Take 100 mg by mouth 2 (two) times daily.    Marland Kitchen ELIQUIS 5 MG TABS tablet TAKE ONE TABLET TWICE DAILY 180 tablet 3  . enalapril (VASOTEC) 20 MG tablet Take 1 tablet (20 mg total) by mouth daily. 90 tablet 3  . febuxostat (ULORIC) 40 MG tablet TAKE ONE TABLET EACH DAY 90 tablet 3  . furosemide (LASIX) 40 MG tablet Take 40 mg by mouth 2 (two) times daily.    . hydrALAZINE (APRESOLINE) 100 MG tablet TAKE ONE TABLET EVERY EIGHT HOURS 270 tablet 3  . LORazepam (ATIVAN) 0.5 MG tablet Take 1 tablet (0.5 mg total) by mouth every 4 (four) hours as needed for anxiety (panic attack). 30 tablet 0  . PARoxetine (PAXIL) 20 MG tablet Take 1 tablet (20 mg total) by mouth daily. 30 tablet 1  . probenecid (BENEMID) 500 MG tablet Take 1 tablet (500 mg total) by mouth 2 (two) times daily. For gout 60 tablet 12  . triamcinolone ointment (KENALOG) 0.1 % Apply topically 2 (two) times daily. 30 g 0   No current facility-administered medications for this visit.     Allergies:   Asa [aspirin]; Allopurinol; and Other  Social History:  The patient  reports that he quit smoking about 26 years ago. He has never used smokeless tobacco. He reports that he does not drink alcohol or use drugs.   Family History:  The patient's family history includes Cancer in his mother; Osteoporosis in his mother.    ROS:  Please see the history of present illness. All other systems are reviewed and negative.    PHYSICAL EXAM: VS:  BP 140/68 (BP Location: Left Arm, Patient Position: Sitting, Cuff Size: Normal)   Pulse 64   Ht 6' (1.829 m)   Wt 247 lb (112 kg)   BMI 33.50 kg/m  , BMI Body mass index is 33.5 kg/m. GEN: Well nourished, well developed, male in no acute distress  HEENT: normal for age  Neck: Minimal JVD, no carotid  bruit, no masses Cardiac: Irregular rate and rhythm; soft murmur, no rubs, or gallops Respiratory: Decreased breath sounds bases, few scattered rales bilaterally, normal work of breathing GI: soft, nontender, nondistended, + BS MS: no deformity or atrophy; trace edema; distal pulses are 2+ in all 4 extremities   Skin: warm and dry, no rash Neuro:  Strength and sensation are intact Psych: euthymic mood, full affect   EKG:  EKG is not ordered today.   Recent Labs: 08/12/2016: ALT 14; B Natriuretic Peptide 498.9 08/13/2016: Magnesium 2.1 08/14/2016: TSH 2.080 12/22/2016: Hemoglobin 10.7; Platelets 536 02/25/2017: BUN 11; Creatinine, Ser 0.98; NT-Pro BNP 1,084; Potassium 3.6; Sodium 144    Lipid Panel    Component Value Date/Time   CHOL 99 08/12/2016 1501   TRIG 137 08/12/2016 1501   HDL 36 (L) 08/12/2016 1501   CHOLHDL 2.8 08/12/2016 1501   VLDL 27 08/12/2016 1501   LDLCALC 36 08/12/2016 1501     Wt Readings from Last 3 Encounters:  03/04/17 247 lb (112 kg)  02/25/17 253 lb (114.8 kg)  12/29/16 251 lb (113.9 kg)     Other studies Reviewed: Additional studies/ records that were reviewed today include: Office notes, hospital records and testing.  ASSESSMENT AND PLAN:  1.  Chronic diastolic CHF: He is at or very close to his baseline weight. He is managing his heart failure much better. He is watching his sodium more carefully and is conscious of what he drinks. His understanding of what it takes to maintain his weight is improving.  He feels that his weight could be maintained with Lasix 40 mg a day but I do not believe this. I think he may be able to maintain his weight with Lasix 40 mg daily with an additional dose 4 days a week. It is okay for him to take an extra dose once a week or so if 40 mg twice a day is not enough to maintain his weight. We will check a BMET today.  2. Persistent atrial fibrillation: His rate is controlled on exam. He is not having any palpitations, no  presyncope or syncope. He is compliant with the Eliquis. He is to continue the Coreg twice a day.  3. Hypertension: His blood pressure is above target, but he has not had his medications today. He also has increased social stressors right now. Continue to follow   Current medicines are reviewed at length with the patient today.  The patient does not have concerns regarding medicines.  The following changes have been made:  Titrate Lasix to maintain weight taking per AVS instructions  Labs/ tests ordered today include:   Orders Placed This Encounter  Procedures  .  Basic Metabolic Panel (BMET)     Disposition:   FU with Dr. Duke Salvia  Signed, Mazy Culton, Bjorn Loser, PA-C  03/04/2017 1:29 PM    [facility] Phone: [phone]; Fax: [phone]  This note was written with the assistance of speech recognition software. Please excuse any transcriptional errors.

## 2017-03-04 NOTE — Patient Instructions (Signed)
Medication Instructions:  Take lasix 40mg  every morning Take lasix 40mg  in the evening 4 days a week.  Ok to increase to 40mg  in the AM and 40mg  in the PM if needed for swelling or weight gain.  If weight still increases-ok to take 80mg  in the AM and 40mg  in the PM.   Labwork: Please return for labs in 2 WEEKS (BMET). You can come anytime, no appointment is needed.  Closed for lunch 12:45-1:45.  Testing/Procedures: NONE  Follow-Up: Your physician recommends that you schedule a follow-up appointment in: 2 months with Dr. Duke Salviaandolph or [name] PA   Any Other Special Instructions Will Be Listed Below (If Applicable).     If you need a refill on your cardiac medications before your next appointment, please call your pharmacy.

## 2017-03-16 LAB — BASIC METABOLIC PANEL
BUN / CREAT RATIO: 9 — AB (ref 10–24)
BUN: 9 mg/dL (ref 8–27)
CO2: 26 mmol/L (ref 20–29)
CREATININE: 1.03 mg/dL (ref 0.76–1.27)
Calcium: 9 mg/dL (ref 8.6–10.2)
Chloride: 102 mmol/L (ref 96–106)
GFR, EST AFRICAN AMERICAN: 81 mL/min/{1.73_m2} (ref >59)
GFR, EST NON AFRICAN AMERICAN: 70 mL/min/{1.73_m2} (ref >59)
Glucose: 107 mg/dL — ABNORMAL HIGH (ref 65–99)
Potassium: 3.8 mmol/L (ref 3.5–5.2)
SODIUM: 144 mmol/L (ref 134–144)

## 2017-05-06 ENCOUNTER — Encounter: Payer: Self-pay | Admitting: Cardiovascular Disease

## 2017-05-06 ENCOUNTER — Ambulatory Visit (INDEPENDENT_AMBULATORY_CARE_PROVIDER_SITE_OTHER): Payer: Medicare Other | Admitting: Cardiovascular Disease

## 2017-05-06 VITALS — BP 132/54 | HR 66 | Ht 72.0 in | Wt 247.2 lb

## 2017-05-06 DIAGNOSIS — I4819 Other persistent atrial fibrillation: Secondary | ICD-10-CM

## 2017-05-06 DIAGNOSIS — I10 Essential (primary) hypertension: Secondary | ICD-10-CM | POA: Diagnosis not present

## 2017-05-06 DIAGNOSIS — E7800 Pure hypercholesterolemia, unspecified: Secondary | ICD-10-CM

## 2017-05-06 DIAGNOSIS — I5032 Chronic diastolic (congestive) heart failure: Secondary | ICD-10-CM

## 2017-05-06 DIAGNOSIS — I481 Persistent atrial fibrillation: Secondary | ICD-10-CM

## 2017-05-06 NOTE — Progress Notes (Signed)
Cardiology Office Note   Date:  05/06/2017   ID:  Shane Mathews, DOB 03-05-1941, MRN [id number]  PCP:  [name], [name], MD  Cardiologist:   Chilton Siiffany , MD   No chief complaint on file.    History of Present Illness: Shane Mathews is a 76 y.o. male with chronic diastolic heart failure, hypertension, atrial fibrillation, moderate LVH, PAD and OSA who presents for follow up.  Shane Mathews was first seen 08/2016 when he was hospitalized for new onset atrial fibrillation and acute diastolic heart failure.  Echocardiogram that admission revealed LVEF 60-65% with moderate left ventricular hypertrophy and mild mitral regurgitation. His left atrium was severely enlarged.  At his last appointment he was noted to be volume overloaded. BNP was over 1000. Furosemide was increased 240 mg twice daily. He followed up with [name], PA-C on 02/2017 And was doing much better. Since that time he takes 40 mg of Lasix every a.m. And an additional 40 mg every other afternoon. His weight has been stable and his edema has improved significantly. He is able to wear shoes that he has been unable to wear her years. He is also very happy that his gout is under much better control.  He is taking Uloric, which has significantly improved his uric acid levels. Since his last appointment his wife underwent surgery for a bleeding brain aneurysm. She is now back home and doing much better. They're both working to limit the salt in her diet.  He doesn't get much formal exercise but has been trying to walk more. He parks his car farther away from the entrance to stores and has been walking in the grocery store.  Past Medical History:  Diagnosis Date  . Acute on chronic diastolic heart failure (HCC) 08/14/2016  . Cataract   . Gout   . Hypertension   . Hypertensive heart disease 08/14/2016    Past Surgical History:  Procedure Laterality Date  . KNEE CARTILAGE SURGERY     left     Current Outpatient  Prescriptions  Medication Sig Dispense Refill  . amLODipine (NORVASC) 10 MG tablet Take 10 mg by mouth daily.    Marland Kitchen. atorvastatin (LIPITOR) 40 MG tablet TAKE ONE TABLET AT 6PM 90 tablet 3  . carvedilol (COREG) 6.25 MG tablet TAKE ONE TABLET TWICE DAILY WITH A MEAL 180 tablet 3  . docusate sodium (COLACE) 100 MG capsule Take 100 mg by mouth 2 (two) times daily.    Marland Kitchen. ELIQUIS 5 MG TABS tablet TAKE ONE TABLET TWICE DAILY 180 tablet 3  . enalapril (VASOTEC) 20 MG tablet Take 1 tablet (20 mg total) by mouth daily. 90 tablet 3  . febuxostat (ULORIC) 40 MG tablet TAKE ONE TABLET EACH DAY 90 tablet 3  . furosemide (LASIX) 40 MG tablet Take 40 mg by mouth 2 (two) times daily.    . hydrALAZINE (APRESOLINE) 100 MG tablet TAKE ONE TABLET EVERY EIGHT HOURS 270 tablet 3  . LORazepam (ATIVAN) 0.5 MG tablet Take 1 tablet (0.5 mg total) by mouth every 4 (four) hours as needed for anxiety (panic attack). 30 tablet 0  . PARoxetine (PAXIL) 20 MG tablet Take 1 tablet (20 mg total) by mouth daily. 30 tablet 1  . probenecid (BENEMID) 500 MG tablet Take 1 tablet (500 mg total) by mouth 2 (two) times daily. For gout 60 tablet 12  . triamcinolone ointment (KENALOG) 0.1 % Apply topically 2 (two) times daily. 30 g 0   No current  facility-administered medications for this visit.     Allergies:   Asa [aspirin]; Allopurinol; and Other    Social History:  The patient  reports that he quit smoking about 26 years ago. He has never used smokeless tobacco. He reports that he does not drink alcohol or use drugs.   Family History:  The patient's  family history includes Cancer in his mother; Osteoporosis in his mother.    ROS:  Please see the history of present illness.   Otherwise, review of systems are positive for none.   All other systems are reviewed and negative.    PHYSICAL EXAM: VS:  BP (!) 132/54   Pulse 66   Ht 6' (1.829 m)   Wt 112.1 kg (247 lb 3.2 oz)   BMI 33.53 kg/m  , BMI Body mass index is 33.53  kg/m. GENERAL:  Well appearing.  No acute distress.   HEENT:  Pupils equal round and reactive, fundi not visualized, oral mucosa unremarkable NECK:  No jugular venous distention, waveform within normal limits, carotid upstroke brisk and symmetric, no bruits LUNGS:  Breath sounds diminished at the L base HEART:  Irregularly irregular.   PMI not displaced or sustained,S1 and S2 within normal limits, no S3, no S4, no clicks, no rubs, no murmurs ABD:  Flat, positive bowel sounds normal in frequency in pitch, no bruits, no rebound, no guarding, no midline pulsatile mass, no hepatomegaly, no splenomegaly EXT:  2 plus pulses throughout, trace edema bilaterally. no cyanosis no clubbing SKIN:  No rashes no nodules NEURO:  Cranial nerves II through XII grossly intact, motor grossly intact throughout PSYCH:  Cognitively intact, oriented to person place and time  EKG:  EKG is ordered today. The ekg ordered today demonstrates atrial fibrillation. Rate 70 bpm. Left axis. Right bundle branch block. Prior inferior infarct.   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  Right TBI of 0.46 and left TBI of 0.47 are suggestive of abnormal arterial flow at rest.   Recent Labs: 08/12/2016: ALT 14; B Natriuretic Peptide 498.9 08/13/2016: Magnesium 2.1 08/14/2016: TSH 2.080 12/22/2016: Hemoglobin 10.7; Platelets 536 02/25/2017: NT-Pro BNP 1,084 03/16/2017: BUN 9; Creatinine, Ser 1.03; Potassium 3.8; Sodium 144    Lipid Panel    Component Value Date/Time   CHOL 99 08/12/2016 1501   TRIG 137 08/12/2016 1501   HDL 36 (L) 08/12/2016 1501   CHOLHDL 2.8 08/12/2016 1501   VLDL 27 08/12/2016 1501   LDLCALC 36 08/12/2016 1501      Wt Readings from Last 3 Encounters:  05/06/17 112.1 kg (247 lb 3.2 oz)  03/04/17 112 kg (247 lb)  02/25/17 114.8 kg (253 lb)      ASSESSMENT AND PLAN:  # Persistent atrial fibrillation:  Rates are  well-controlled. He remains  asymptomatic. Continue carvedilol and Eliquis.   # Chronic diastolic heart failure:  Mr. Matton volume status Has improved significantly since increasing Lasix. Renal function has been stable. Continue carvedilol, enalapril, and furosemide.  # Hypertension:  BP is controlled on amlodipine, carvedilol, enalapril and hydralazine.   # Hyperlipidemia:  Continue atorvastatin.  LDL 36 08/2016.We will check fasting lipids at his follow-up appointment.   Current medicines are reviewed at length with the patient today.  The patient does not have concerns regarding medicines.  The following changes have been made:  no change  Labs/ tests ordered today include:   No orders of the defined types  were placed in this encounter.    Disposition:   FU with Annalyssa Thune C. Duke Salvia, MD, [facility] in 6 months.     This note was written with the assistance of speech recognition software.  Please excuse any transcriptional errors.  Signed, Jazz Biddy C. Duke Salvia, MD, [facility]  05/06/2017 5:41 PM    [facility]

## 2017-05-06 NOTE — Patient Instructions (Signed)

## 2017-06-23 ENCOUNTER — Other Ambulatory Visit: Payer: Self-pay | Admitting: Family Medicine

## 2017-07-01 ENCOUNTER — Encounter: Payer: Self-pay | Admitting: Family Medicine

## 2017-07-01 ENCOUNTER — Ambulatory Visit (INDEPENDENT_AMBULATORY_CARE_PROVIDER_SITE_OTHER): Payer: Medicare Other | Admitting: Family Medicine

## 2017-07-01 DIAGNOSIS — I10 Essential (primary) hypertension: Secondary | ICD-10-CM

## 2017-07-01 DIAGNOSIS — I4819 Other persistent atrial fibrillation: Secondary | ICD-10-CM

## 2017-07-01 DIAGNOSIS — I481 Persistent atrial fibrillation: Secondary | ICD-10-CM

## 2017-07-01 DIAGNOSIS — M109 Gout, unspecified: Secondary | ICD-10-CM

## 2017-07-01 DIAGNOSIS — Z0001 Encounter for general adult medical examination with abnormal findings: Secondary | ICD-10-CM | POA: Diagnosis not present

## 2017-07-01 DIAGNOSIS — Z0000 Encounter for general adult medical examination without abnormal findings: Secondary | ICD-10-CM

## 2017-07-01 DIAGNOSIS — I1A0 Resistant hypertension: Secondary | ICD-10-CM

## 2017-07-01 DIAGNOSIS — D509 Iron deficiency anemia, unspecified: Secondary | ICD-10-CM

## 2017-07-01 DIAGNOSIS — I5032 Chronic diastolic (congestive) heart failure: Secondary | ICD-10-CM

## 2017-07-01 DIAGNOSIS — M1000 Idiopathic gout, unspecified site: Secondary | ICD-10-CM | POA: Diagnosis not present

## 2017-07-01 NOTE — Patient Instructions (Addendum)
Please schedule an annual wellness visit with my nurse, Lauren, as you leave. You are doing well.  See me in three months. I will call tomorrow with the blood test results.   I hope your wife gets better and nobody has to take of the other person.

## 2017-07-02 DIAGNOSIS — Z0000 Encounter for general adult medical examination without abnormal findings: Secondary | ICD-10-CM | POA: Insufficient documentation

## 2017-07-02 LAB — CBC
HEMATOCRIT: 32.6 % — AB (ref 37.5–51.0)
HEMOGLOBIN: 10.3 g/dL — AB (ref 13.0–17.7)
MCH: 20.7 pg — ABNORMAL LOW (ref 26.6–33.0)
MCHC: 31.6 g/dL (ref 31.5–35.7)
MCV: 66 fL — ABNORMAL LOW (ref 79–97)
Platelets: 304 10*3/uL (ref 150–379)
RBC: 4.97 x10E6/uL (ref 4.14–5.80)
RDW: 17.4 % — ABNORMAL HIGH (ref 12.3–15.4)
WBC: 8.8 10*3/uL (ref 3.4–10.8)

## 2017-07-02 NOTE — Progress Notes (Signed)
   Subjective:    Patient ID: Shane Mathews, male    DOB: 1940/10/28, 76 y.o.   MRN: [id number]  HPI Annual exam.  Multiple med issues: 1. Severe hypertension requiring multiple meds to control.  Home readings have been fine.  Tolerating current meds well.  2. Poly articular gout.  Much better on current meds.  No recent gout flairs. 3. persistant A fib.  Asymptomatic on current meds.   4. No recent annual medicare wellness visit. 5. Home stressors, wife fell and has a painful lumbar compression fx.  Roles have reversed.  He is now taking care of her rather than the opposite.  She is expected to fully recover. 6. Anemia.  Listed as iron deficiency in the chart.  Will recheck.    HPDP  Nicely up to date.    Review of Systems No CP, DOE, cough, fever, headache, nausea, heartburn, bleedings, stool change, difficulty urinating or worrisome skin changes.     Objective:   Physical Exam HEENT normal Neck supple without mass Lungs clear Cardiac RRR without m or g Abd benign. Ext trace edema.   Neuro.  WNL       Assessment & Plan:

## 2017-07-02 NOTE — Assessment & Plan Note (Signed)
I am delighted that he is finally taking a serious interest in his health.

## 2017-07-02 NOTE — Assessment & Plan Note (Signed)
Well controled on current meds 

## 2017-07-02 NOTE — Assessment & Plan Note (Signed)
Stable on current meds 

## 2017-07-02 NOTE — Assessment & Plan Note (Signed)
Looking back, I do not see and confirmation of the iron deficiency anemia.  He has also consistently had low grade anemia and hypo micro indices.  I wonder if we are dealing with a Beta Thal minor.  Will check iron studies to verify dx.  Called and left message to have more blood drawn.

## 2017-07-02 NOTE — Assessment & Plan Note (Signed)
A bit high today, but home BPs indicate good control

## 2017-07-06 ENCOUNTER — Other Ambulatory Visit: Payer: Medicare Other

## 2017-07-06 DIAGNOSIS — D509 Iron deficiency anemia, unspecified: Secondary | ICD-10-CM

## 2017-07-07 ENCOUNTER — Other Ambulatory Visit: Payer: Self-pay | Admitting: Family Medicine

## 2017-07-07 ENCOUNTER — Encounter: Payer: Self-pay | Admitting: Gastroenterology

## 2017-07-07 DIAGNOSIS — D509 Iron deficiency anemia, unspecified: Secondary | ICD-10-CM

## 2017-07-07 LAB — IRON AND TIBC
IRON SATURATION: 6 % — AB (ref 15–55)
IRON: 21 ug/dL — AB (ref 38–169)
TIBC: 360 ug/dL (ref 250–450)
UIBC: 339 ug/dL (ref 111–343)

## 2017-07-07 MED ORDER — FERROUS SULFATE 325 (65 FE) MG PO TABS: 325.0000 mg | ORAL_TABLET | Freq: Two times a day (BID) | ORAL | 3 refills | Status: AC

## 2017-07-07 NOTE — Assessment & Plan Note (Signed)
Now documented low iron.

## 2017-07-08 ENCOUNTER — Ambulatory Visit: Payer: Medicare Other | Admitting: *Deleted

## 2017-07-09 ENCOUNTER — Ambulatory Visit (INDEPENDENT_AMBULATORY_CARE_PROVIDER_SITE_OTHER): Payer: Medicare Other | Admitting: *Deleted

## 2017-07-09 ENCOUNTER — Encounter: Payer: Self-pay | Admitting: *Deleted

## 2017-07-09 VITALS — BP 190/64 | HR 51 | Temp 98.3°F | Ht 72.0 in | Wt 253.6 lb

## 2017-07-09 DIAGNOSIS — R7303 Prediabetes: Secondary | ICD-10-CM

## 2017-07-09 DIAGNOSIS — Z0000 Encounter for general adult medical examination without abnormal findings: Secondary | ICD-10-CM | POA: Diagnosis not present

## 2017-07-09 LAB — POCT GLYCOSYLATED HEMOGLOBIN (HGB A1C): HEMOGLOBIN A1C: 5.6

## 2017-07-09 NOTE — Progress Notes (Signed)
Subjective:   Shane Mathews is a 76 y.o. male who presents for Medicare Annual/Subsequent preventive examination.  Cardiac Risk Factors include: advanced age (>14men, >51 women);dyslipidemia;hypertension;male gender;obesity (BMI >30kg/m2);sedentary lifestyle;smoking/ tobacco exposure     Objective:    Vitals: BP (!) 190/64 (BP Location: Left Arm, Patient Position: Sitting, Cuff Size: Normal)   Pulse (!) 51   Temp 98.3 F (36.8 C) (Oral)   Ht 6' (1.829 m)   Wt 253 lb 9.6 oz (115 kg)   SpO2 97%   BMI 34.39 kg/m   Body mass index is 34.39 kg/m.  Patient has not yet taken BP meds this AM. Will eat and take meds as soon as he arrives home. Patient has home BP monitoring device. Patient instructed on most accurate way to take BP, to keep BP log with date and time and how he is feeling and bring log to all future visits.  Tobacco History  Smoking Status  . Former Smoker  . Packs/day: 2.00  . Years: 25.00  . Types: Cigarettes  . Quit date: 01/07/1991  Smokeless Tobacco  . Never Used     Counseling given: Yes Patient is former smoker with no plans to restart   Past Medical History:  Diagnosis Date  . Acute on chronic diastolic heart failure (HCC) 08/14/2016  . Cataract   . Gout   . Hypertension   . Hypertensive heart disease 08/14/2016   Past Surgical History:  Procedure Laterality Date  . KNEE CARTILAGE SURGERY     left   Family History  Problem Relation Age of Onset  . Osteoporosis Mother   . Cancer Mother   . Heart disease Father    History  Sexual Activity  . Sexual activity: Not Currently    Outpatient Encounter Prescriptions as of 07/09/2017  Medication Sig  . amLODipine (NORVASC) 10 MG tablet TAKE ONE TABLET EACH DAY  . atorvastatin (LIPITOR) 40 MG tablet TAKE ONE TABLET AT 6PM  . carvedilol (COREG) 6.25 MG tablet TAKE ONE TABLET TWICE DAILY WITH A MEAL  . ELIQUIS 5 MG TABS tablet TAKE ONE TABLET TWICE DAILY  . enalapril (VASOTEC) 20 MG tablet Take 1  tablet (20 mg total) by mouth daily.  . febuxostat (ULORIC) 40 MG tablet TAKE ONE TABLET EACH DAY  . ferrous sulfate 325 (65 FE) MG tablet Take 1 tablet (325 mg total) by mouth 2 (two) times daily with a meal.  . furosemide (LASIX) 40 MG tablet Take 40 mg by mouth 2 (two) times daily.  . hydrALAZINE (APRESOLINE) 100 MG tablet TAKE ONE TABLET EVERY EIGHT HOURS  . probenecid (BENEMID) 500 MG tablet Take 1 tablet (500 mg total) by mouth 2 (two) times daily. For gout  . triamcinolone ointment (KENALOG) 0.1 % Apply topically 2 (two) times daily.  Marland Kitchen docusate sodium (COLACE) 100 MG capsule Take 100 mg by mouth 2 (two) times daily.  Marland Kitchen LORazepam (ATIVAN) 0.5 MG tablet Take 1 tablet (0.5 mg total) by mouth every 4 (four) hours as needed for anxiety (panic attack). (Patient not taking: Reported on 07/09/2017)  . PARoxetine (PAXIL) 20 MG tablet Take 1 tablet (20 mg total) by mouth daily. (Patient not taking: Reported on 07/09/2017)   No facility-administered encounter medications on file as of 07/09/2017.     Activities of Daily Living In your present state of health, do you have any difficulty performing the following activities: 07/09/2017 08/21/2016  Hearing? Y -  Vision? Y -  Difficulty concentrating or making decisions?  N -  Walking or climbing stairs? Y -  Dressing or bathing? N -  Doing errands, shopping? N Y  Quarry managerreparing Food and eating ? Y -  Using the Toilet? Y -  In the past six months, have you accidently leaked urine? N -  Do you have problems with loss of bowel control? N -  Managing your Medications? N -  Managing your Finances? N -  Housekeeping or managing your Housekeeping? N -  Some recent data might be hidden   Home Safety:  My home has a working smoke alarm:  Yes X 5           My home throw rugs have been fastened down to the floor or removed:  Removed I have a non-slip surface or non-slip mats in the bathtub and shower:  Non-slip surface and grab bars        All my home's  stairs have handrails, including any outdoor stairs  One level home with 4-5 outside steps with handrails          My home's floors, stairs and hallways are free from clutter, wires and cords:  Yes     I have animals in my home  No I wear seatbelts consistently:  Yes   Patient Care Team: [name], [name], MD as PCP - General Mckinley JewelStoneburner, Sara, MD (Ophthalmology) Barrett, Joline Salthonda G, PA-C as Physician Assistant (Cardiology)    Assessment:     Exercise Activities and Dietary recommendations Current Exercise Habits: Home exercise routine, Type of exercise: walking;strength training/weights (Uses bands for to strengthen arms and legs), Time (Minutes): 10, Frequency (Times/Week): 7, Weekly Exercise (Minutes/Week): 70, Intensity: Mild, Exercise limited by: None identified  Goals    . Blood Pressure < 150/90    . Eat more fruits and vegetables    . Exercise 3x per week (30 min per time)          Using band to strengthen arms and legs  Looking into [employer] or [facility] centers      Fall Risk Fall Risk  07/09/2017 07/01/2017 09/10/2016 08/28/2016 08/12/2016  Falls in the past year? No No No No No  Risk for fall due to : Impaired balance/gait;Impaired mobility - - - -   TUG Test:  Done in 15 seconds. Patient used both hands to push out of chair and to sit back down. Had slow tentative pace and little to no arm swing. Discussed increasing walking to strengthen legs and decrease fall risk. Falls prevention discussed in detail and literature given.  Cognitive Function: Mini-Cog  Passed with score 4/5   Depression Screen PHQ 2/9 Scores 07/09/2017 07/01/2017 09/10/2016 08/28/2016  PHQ - 2 Score 0 0 0 0    Cognitive Function MMSE - Mini Mental State Exam 01/07/2011  Orientation to time 5  Orientation to Place 5  Registration 3  Attention/ Calculation 5  Recall 3  Language- name 2 objects 2  Language- repeat 1  Language- follow 3 step command 3  Language- read & follow direction 1  Write a  sentence 1  Copy design 1  Total score 30        Immunization History  Administered Date(s) Administered  . Influenza Split 06/24/2012, 06/23/2015  . Influenza Whole 08/24/2009  . Influenza-Unspecified 07/23/2016, 06/10/2017  . Pneumococcal Conjugate-13 03/15/2014  . Pneumococcal Polysaccharide-23 09/09/2000, 04/20/2009  . Td 07/10/1999, 04/20/2009   Screening Tests Health Maintenance  Topic Date Due  . TETANUS/TDAP  04/21/2019  . INFLUENZA VACCINE  Completed  . PNA vac Low Risk Adult  Completed   Discussed shingrix    Plan:    Hgb A1c done today = 5.6 Last A1c 6.4 on 08/12/2016  I have personally reviewed and noted the following in the patient's chart:   . Medical and social history . Use of alcohol, tobacco or illicit drugs  . Current medications and supplements . Functional ability and status . Nutritional status . Physical activity . Advanced directives . List of other physicians . Hospitalizations, surgeries, and ER visits in previous 12 months . Vitals . Screenings to include cognitive, depression, and falls . Referrals and appointments  In addition, I have reviewed and discussed with patient certain preventive protocols, quality metrics, and best practice recommendations. A written personalized care plan for preventive services as well as general preventive health recommendations were provided to patient.     Fredderick Severance, RN  07/09/2017

## 2017-07-09 NOTE — Patient Instructions (Addendum)
Mr. Ganoe,  Thank you for taking time to come for yourMedicare Wellness Visit. I appreciate your ongoing commitment to your health goals. Please review the following plan we discussed and let me know if I can assist you in the future.   These are the goals we discussed:  Goals    . Blood Pressure < 150/90    . Eat more fruits and vegetables    . Exercise 3x per week (30 min per time)          Using band to strengthen arms and legs  Looking into [employer] or [employer] centers       Fall Prevention in the Home Falls can cause injuries. They can happen to people of all ages. There are many things you can do to make your home safe and to help prevent falls. What can I do on the outside of my home?  Regularly fix the edges of walkways and driveways and fix any cracks.  Remove anything that might make you trip as you walk through a door, such as a raised step or threshold.  Trim any bushes or trees on the path to your home.  Use bright outdoor lighting.  Clear any walking paths of anything that might make someone trip, such as rocks or tools.  Regularly check to see if handrails are loose or broken. Make sure that both sides of any steps have handrails.  Any raised decks and porches should have guardrails on the edges.  Have any leaves, snow, or ice cleared regularly.  Use sand or salt on walking paths during winter.  Clean up any spills in your garage right away. This includes oil or grease spills. What can I do in the bathroom?  Use night lights.  Install grab bars by the toilet and in the tub and shower. Do not use towel bars as grab bars.  Use non-skid mats or decals in the tub or shower.  If you need to sit down in the shower, use a plastic, non-slip stool.  Keep the floor dry. Clean up any water that spills on the floor as soon as it happens.  Remove soap buildup in the tub or shower regularly.  Attach bath mats securely with double-sided non-slip rug tape.  Do not  have throw rugs and other things on the floor that can make you trip. What can I do in the bedroom?  Use night lights.  Make sure that you have a light by your bed that is easy to reach.  Do not use any sheets or blankets that are too big for your bed. They should not hang down onto the floor.  Have a firm chair that has side arms. You can use this for support while you get dressed.  Do not have throw rugs and other things on the floor that can make you trip. What can I do in the kitchen?  Clean up any spills right away.  Avoid walking on wet floors.  Keep items that you use a lot in easy-to-reach places.  If you need to reach something above you, use a strong step stool that has a grab bar.  Keep electrical cords out of the way.  Do not use floor polish or wax that makes floors slippery. If you must use wax, use non-skid floor wax.  Do not have throw rugs and other things on the floor that can make you trip. What can I do with my stairs?  Do not leave any items  on the stairs.  Make sure that there are handrails on both sides of the stairs and use them. Fix handrails that are broken or loose. Make sure that handrails are as long as the stairways.  Check any carpeting to make sure that it is firmly attached to the stairs. Fix any carpet that is loose or worn.  Avoid having throw rugs at the top or bottom of the stairs. If you do have throw rugs, attach them to the floor with carpet tape.  Make sure that you have a light switch at the top of the stairs and the bottom of the stairs. If you do not have them, ask someone to add them for you. What else can I do to help prevent falls?  Wear shoes that: ? Do not have high heels. ? Have rubber bottoms. ? Are comfortable and fit you well. ? Are closed at the toe. Do not wear sandals.  If you use a stepladder: ? Make sure that it is fully opened. Do not climb a closed stepladder. ? Make sure that both sides of the stepladder are  locked into place. ? Ask someone to hold it for you, if possible.  Clearly mark and make sure that you can see: ? Any grab bars or handrails. ? First and last steps. ? Where the edge of each step is.  Use tools that help you move around (mobility aids) if they are needed. These include: ? Canes. ? Walkers. ? Scooters. ? Crutches.  Turn on the lights when you go into a dark area. Replace any light bulbs as soon as they burn out.  Set up your furniture so you have a clear path. Avoid moving your furniture around.  If any of your floors are uneven, fix them.  If there are any pets around you, be aware of where they are.  Review your medicines with your doctor. Some medicines can make you feel dizzy. This can increase your chance of falling. Ask your doctor what other things that you can do to help prevent falls. This information is not intended to replace advice given to you by your health care provider. Make sure you discuss any questions you have with your health care provider. Document Released: 06/21/2009 Document Revised: 01/31/2016 Document Reviewed: 09/29/2014 Elsevier Interactive Patient Education  2018 [employer]   Health Maintenance, Male A healthy lifestyle and preventive care is important for your health and wellness. Ask your health care provider about what schedule of regular examinations is right for you. What should I know about weight and diet? Eat a Healthy Diet  Eat plenty of vegetables, fruits, whole grains, low-fat dairy products, and lean protein.  Do not eat a lot of foods high in solid fats, added sugars, or salt.  Maintain a Healthy Weight Regular exercise can help you achieve or maintain a healthy weight. You should:  Do at least 150 minutes of exercise each week. The exercise should increase your heart rate and make you sweat (moderate-intensity exercise).  Do strength-training exercises at least twice a week.  Watch Your Levels of Cholesterol  and Blood Lipids  Have your blood tested for lipids and cholesterol every 5 years starting at 76 years of age. If you are at high risk for heart disease, you should start having your blood tested when you are 76 years old. You may need to have your cholesterol levels checked more often if: ? Your lipid or cholesterol levels are high. ? You are older than  76 years of age. ? You are at high risk for heart disease.  What should I know about cancer screening? Many types of cancers can be detected early and may often be prevented. Lung Cancer  You should be screened every year for lung cancer if: ? You are a current smoker who has smoked for at least 30 years. ? You are a former smoker who has quit within the past 15 years.  Talk to your health care provider about your screening options, when you should start screening, and how often you should be screened.  Colorectal Cancer  Routine colorectal cancer screening usually begins at 76 years of age and should be repeated every 5-10 years until you are 76 years old. You may need to be screened more often if early forms of precancerous polyps or small growths are found. Your health care provider may recommend screening at an earlier age if you have risk factors for colon cancer.  Your health care provider may recommend using home test kits to check for hidden blood in the stool.  A small camera at the end of a tube can be used to examine your colon (sigmoidoscopy or colonoscopy). This checks for the earliest forms of colorectal cancer.  Prostate and Testicular Cancer  Depending on your age and overall health, your health care provider may do certain tests to screen for prostate and testicular cancer.  Talk to your health care provider about any symptoms or concerns you have about testicular or prostate cancer.  Skin Cancer  Check your skin from head to toe regularly.  Tell your health care provider about any new moles or changes in moles,  especially if: ? There is a change in a mole's size, shape, or color. ? You have a mole that is larger than a pencil eraser.  Always use sunscreen. Apply sunscreen liberally and repeat throughout the day.  Protect yourself by wearing long sleeves, pants, a wide-brimmed hat, and sunglasses when outside.  What should I know about heart disease, diabetes, and high blood pressure?  If you are 17-69 years of age, have your blood pressure checked every 3-5 years. If you are 46 years of age or older, have your blood pressure checked every year. You should have your blood pressure measured twice-once when you are at a hospital or clinic, and once when you are not at a hospital or clinic. Record the average of the two measurements. To check your blood pressure when you are not at a hospital or clinic, you can use: ? An automated blood pressure machine at a pharmacy. ? A home blood pressure monitor.  Talk to your health care provider about your target blood pressure.  If you are between 27-33 years old, ask your health care provider if you should take aspirin to prevent heart disease.  Have regular diabetes screenings by checking your fasting blood sugar level. ? If you are at a normal weight and have a low risk for diabetes, have this test once every three years after the age of 60. ? If you are overweight and have a high risk for diabetes, consider being tested at a younger age or more often.  A one-time screening for abdominal aortic aneurysm (AAA) by ultrasound is recommended for men aged 65-75 years who are current or former smokers. What should I know about preventing infection? Hepatitis B If you have a higher risk for hepatitis B, you should be screened for this virus. Talk with your health care provider  to find out if you are at risk for hepatitis B infection. Hepatitis C Blood testing is recommended for:  Everyone born from [birth year] through 1965.  Anyone with known risk factors for  hepatitis C.  Sexually Transmitted Diseases (STDs)  You should be screened each year for STDs including gonorrhea and chlamydia if: ? You are sexually active and are younger than 76 years of age. ? You are older than 76 years of age and your health care provider tells you that you are at risk for this type of infection. ? Your sexual activity has changed since you were last screened and you are at an increased risk for chlamydia or gonorrhea. Ask your health care provider if you are at risk.  Talk with your health care provider about whether you are at high risk of being infected with HIV. Your health care provider may recommend a prescription medicine to help prevent HIV infection.  What else can I do?  Schedule regular health, dental, and eye exams.  Stay current with your vaccines (immunizations).  Do not use any tobacco products, such as cigarettes, chewing tobacco, and e-cigarettes. If you need help quitting, ask your health care provider.  Limit alcohol intake to no more than 2 drinks per day. One drink equals 12 ounces of beer, 5 ounces of wine, or 1 ounces of hard liquor.  Do not use street drugs.  Do not share needles.  Ask your health care provider for help if you need support or information about quitting drugs.  Tell your health care provider if you often feel depressed.  Tell your health care provider if you have ever been abused or do not feel safe at home. This information is not intended to replace advice given to you by your health care provider. Make sure you discuss any questions you have with your health care provider. Document Released: 02/21/2008 Document Revised: 04/23/2016 Document Reviewed: 05/29/2015 Elsevier Interactive Patient Education  [employer].

## 2017-07-13 ENCOUNTER — Telehealth: Payer: Self-pay | Admitting: Family Medicine
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
## 2017-07-14 ENCOUNTER — Encounter: Payer: Self-pay | Admitting: *Deleted

## 2017-07-14 ENCOUNTER — Telehealth: Payer: Self-pay | Admitting: Family Medicine

## 2017-07-14 NOTE — Telephone Encounter (Signed)
Shane PentonLee Ann is with [facility]. She is needing the death certificate signed.  Apparaently [facility] is not signing it.  She wants to know if Dr Leveda AnnaHensel would sign it. Please advise

## 2017-07-14 NOTE — Telephone Encounter (Signed)
Called and told them that I am happy to sign the death certificate.

## 2017-07-14 NOTE — Telephone Encounter (Signed)
Death Certificate was dropped off by [facility] and has been placed in PCP's box for completion. Please use blue or black ink. Please return to Lynette once completed.  °

## 2017-07-14 NOTE — Progress Notes (Signed)
Patient ID: Shane Mathews, male   DOB: [birth year], 76 y.o.   MRN: [id number] I have reviewed this visit and discussed with Alecia LemmingLauren Ducatte, RN, BSN, and agree with her documentation.   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  Sadly, during this visit and at my last visit, he seemed to be doing better than he had in years.  I was especially pleased that he had taken his own health more seriously and was taking steps to be proactive.  Darn.

## 2017-07-15 NOTE — Telephone Encounter (Signed)
Death Certificate Completed.

## 2017-08-08 DEATH — deceased

## 2017-08-26 ENCOUNTER — Ambulatory Visit: Payer: Medicare Other | Admitting: Gastroenterology

## 2018-08-16 IMAGING — DX DG CHEST 2V
2 series · 2 of 2 positions shown · non-contrast
Comparison: None available.

CLINICAL DATA: Initial evaluation for acute dyspnea, shortness of
breath.

EXAM:
CHEST  2 VIEW

[x chest ap]
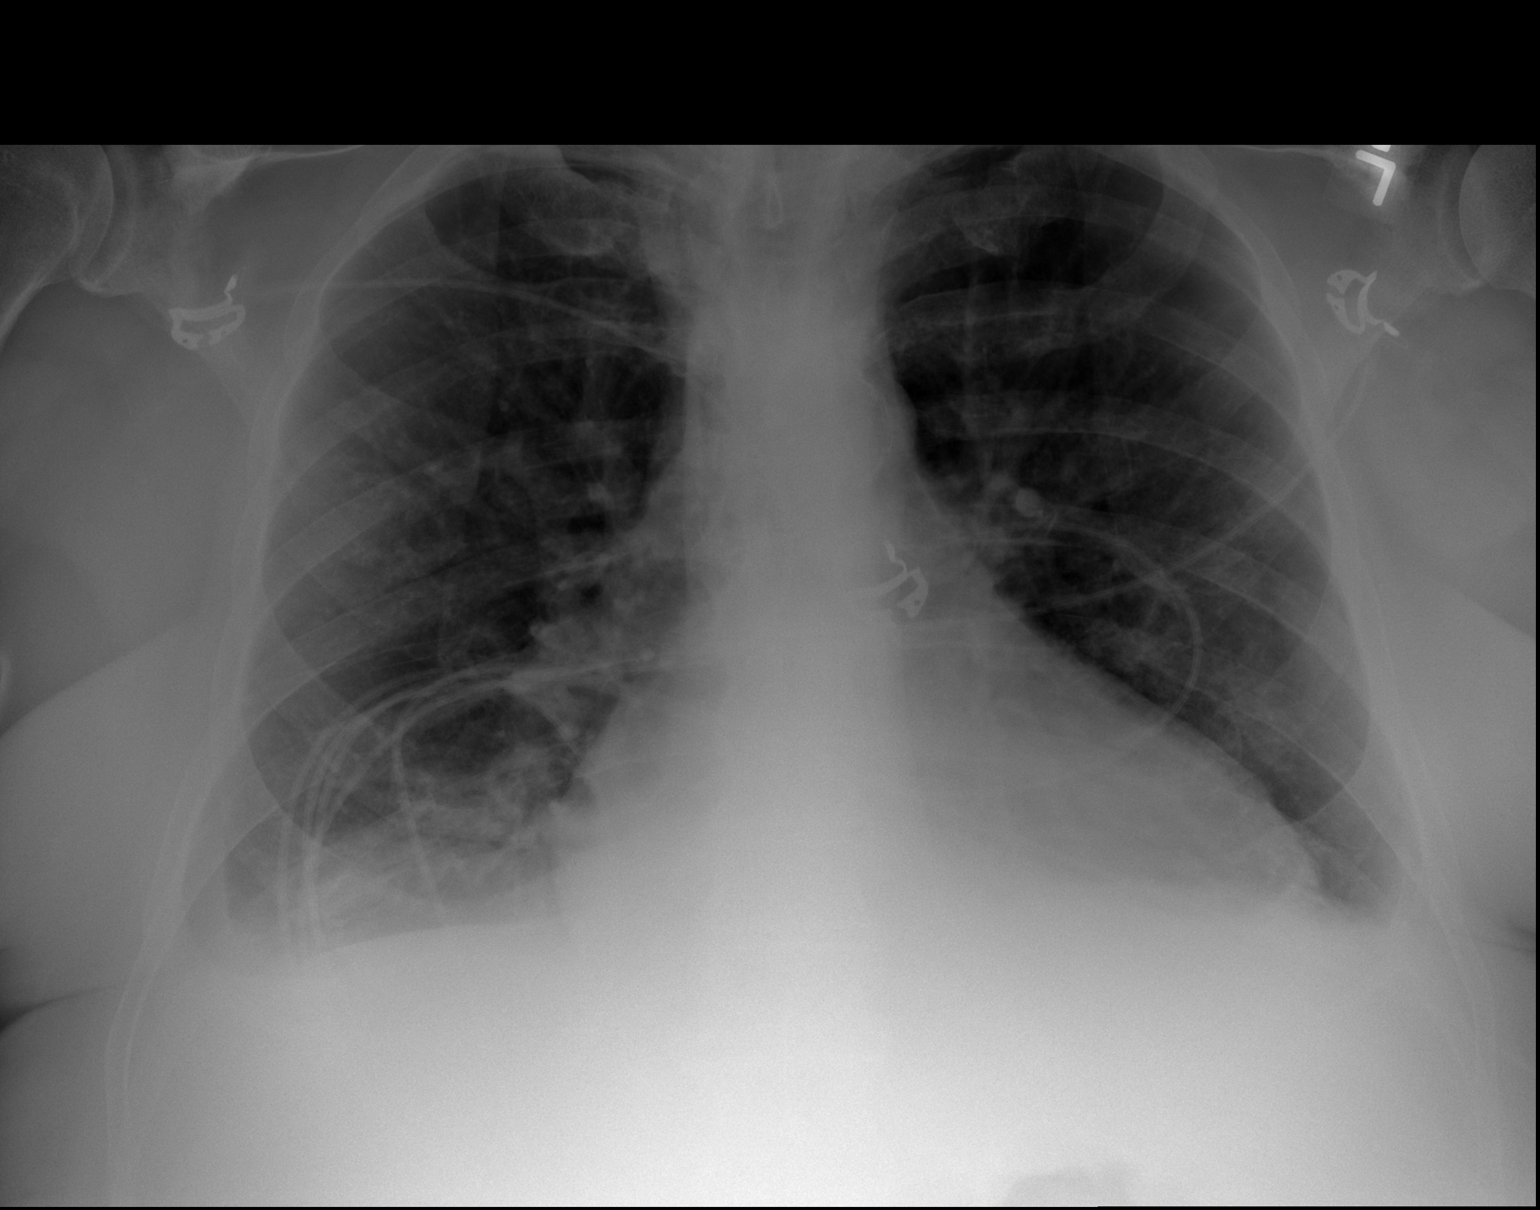

[w chest lat]
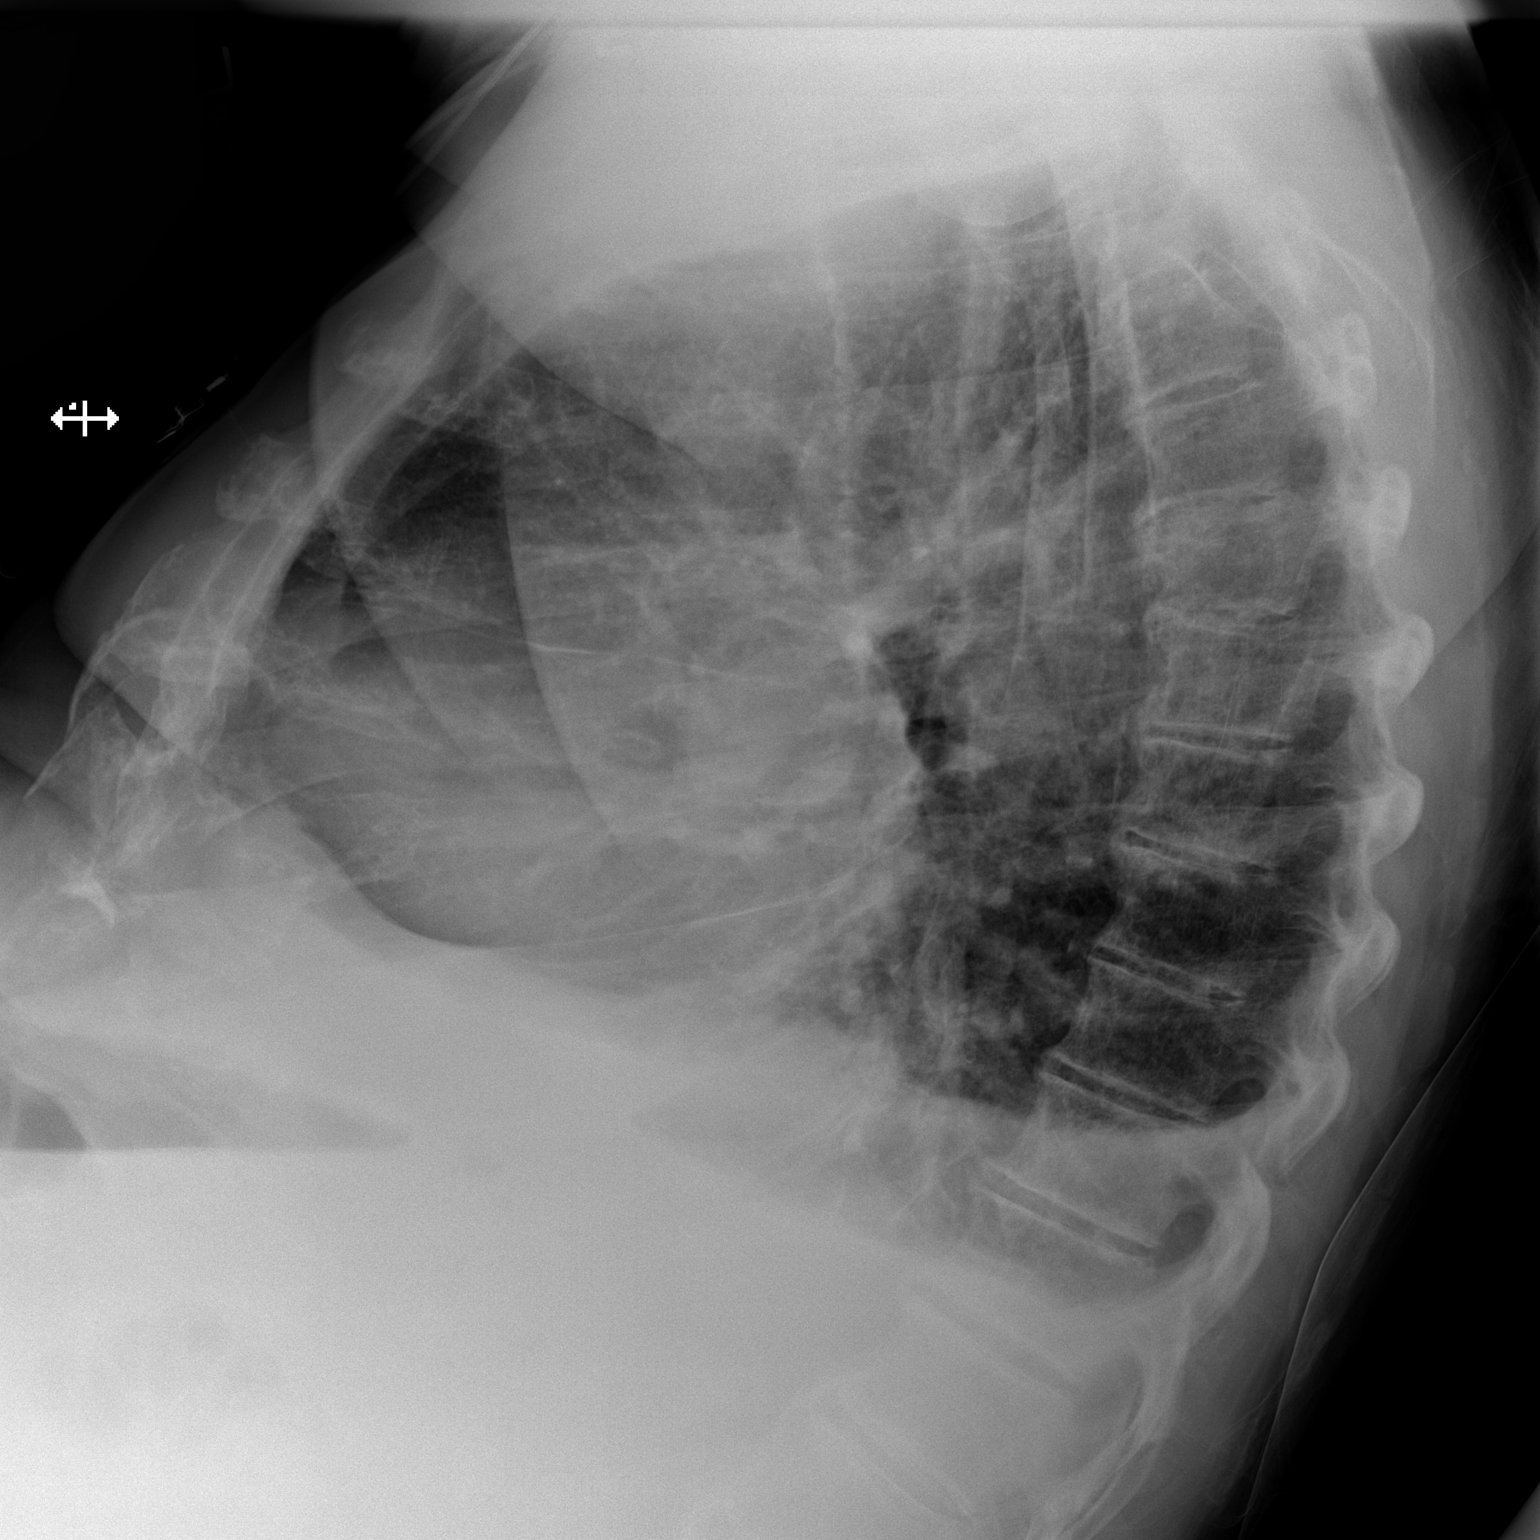

[2 of 2 positions shown; findings below may reference images not displayed]

FINDINGS: Moderate cardiomegaly. Mediastinal silhouette within normal limits.
Aortic atherosclerosis noted.

Lungs somewhat hypoinflated. Mild vascular congestion without overt
pulmonary edema. Small bilateral pleural effusions. Associated
bibasilar opacities favored to reflect effusion and/or atelectasis.
Superimposed infiltrates not entirely excluded, particularly at the
right lung base. No other focal airspace disease. No pneumothorax.

No acute osseous abnormality.
IMPRESSION: 1. Small layering bilateral pleural effusions with associated
bibasilar opacities. Atelectasis is favored, although infiltrates
could be considered in the correct clinical setting.
2. Moderate cardiomegaly with mild diffuse pulmonary vascular
congestion without overt pulmonary edema.
3. Aortic atherosclerosis.
